# Patient Record
Sex: Female | Born: 1940 | Race: White | Hispanic: No | Smoking: Never smoker
Health system: Southern US, Community
[De-identification: ages and names within clinical notes are randomized; demographics above are authoritative.]

## PROBLEM LIST (undated history)

## (undated) DIAGNOSIS — M1990 Unspecified osteoarthritis, unspecified site: Secondary | ICD-10-CM

## (undated) DIAGNOSIS — C801 Malignant (primary) neoplasm, unspecified: Secondary | ICD-10-CM

## (undated) DIAGNOSIS — E119 Type 2 diabetes mellitus without complications: Secondary | ICD-10-CM

## (undated) DIAGNOSIS — K219 Gastro-esophageal reflux disease without esophagitis: Secondary | ICD-10-CM

## (undated) DIAGNOSIS — E538 Deficiency of other specified B group vitamins: Secondary | ICD-10-CM

## (undated) DIAGNOSIS — E785 Hyperlipidemia, unspecified: Secondary | ICD-10-CM

## (undated) DIAGNOSIS — I10 Essential (primary) hypertension: Secondary | ICD-10-CM

## (undated) DIAGNOSIS — R918 Other nonspecific abnormal finding of lung field: Secondary | ICD-10-CM

## (undated) DIAGNOSIS — Z853 Personal history of malignant neoplasm of breast: Secondary | ICD-10-CM

## (undated) DIAGNOSIS — D509 Iron deficiency anemia, unspecified: Secondary | ICD-10-CM

## (undated) HISTORY — PX: MASTECTOMY PARTIAL / LUMPECTOMY: SUR851

## (undated) HISTORY — DX: Other nonspecific abnormal finding of lung field: R91.8

## (undated) HISTORY — DX: Personal history of malignant neoplasm of breast: Z85.3

## (undated) HISTORY — PX: HIP SURGERY: SHX245

## (undated) HISTORY — DX: Deficiency of other specified B group vitamins: E53.8

## (undated) HISTORY — DX: Iron deficiency anemia, unspecified: D50.9

## (undated) HISTORY — DX: Type 2 diabetes mellitus without complications: E11.9

## (undated) HISTORY — DX: Essential (primary) hypertension: I10

## (undated) HISTORY — DX: Hyperlipidemia, unspecified: E78.5

---

## 2000-08-03 ENCOUNTER — Other Ambulatory Visit: Admission: RE | Admit: 2000-08-03 | Discharge: 2000-08-03 | Payer: Self-pay | Admitting: Family Medicine

## 2000-11-05 ENCOUNTER — Encounter (INDEPENDENT_AMBULATORY_CARE_PROVIDER_SITE_OTHER): Payer: Self-pay | Admitting: *Deleted

## 2000-11-05 ENCOUNTER — Ambulatory Visit (HOSPITAL_COMMUNITY): Admission: RE | Admit: 2000-11-05 | Discharge: 2000-11-05 | Payer: Self-pay | Admitting: Gastroenterology

## 2002-07-11 ENCOUNTER — Encounter: Admission: RE | Admit: 2002-07-11 | Discharge: 2002-07-11 | Payer: Self-pay | Admitting: Family Medicine

## 2002-07-11 ENCOUNTER — Encounter: Payer: Self-pay | Admitting: Family Medicine

## 2003-02-16 ENCOUNTER — Inpatient Hospital Stay (HOSPITAL_COMMUNITY): Admission: EM | Admit: 2003-02-16 | Discharge: 2003-02-19 | Payer: Self-pay | Admitting: Emergency Medicine

## 2003-02-17 ENCOUNTER — Encounter (INDEPENDENT_AMBULATORY_CARE_PROVIDER_SITE_OTHER): Payer: Self-pay | Admitting: Specialist

## 2003-02-26 ENCOUNTER — Encounter: Admission: RE | Admit: 2003-02-26 | Discharge: 2003-02-26 | Payer: Self-pay | Admitting: Sports Medicine

## 2003-09-18 ENCOUNTER — Encounter: Admission: RE | Admit: 2003-09-18 | Discharge: 2003-09-18 | Payer: Self-pay | Admitting: Family Medicine

## 2003-09-24 ENCOUNTER — Encounter: Admission: RE | Admit: 2003-09-24 | Discharge: 2003-09-24 | Payer: Self-pay | Admitting: Family Medicine

## 2004-11-04 ENCOUNTER — Other Ambulatory Visit: Admission: RE | Admit: 2004-11-04 | Discharge: 2004-11-04 | Payer: Self-pay | Admitting: Family Medicine

## 2005-01-23 ENCOUNTER — Encounter: Admission: RE | Admit: 2005-01-23 | Discharge: 2005-01-23 | Payer: Self-pay | Admitting: Family Medicine

## 2005-01-23 ENCOUNTER — Encounter (INDEPENDENT_AMBULATORY_CARE_PROVIDER_SITE_OTHER): Payer: Self-pay | Admitting: Diagnostic Radiology

## 2005-01-23 ENCOUNTER — Encounter (INDEPENDENT_AMBULATORY_CARE_PROVIDER_SITE_OTHER): Payer: Self-pay | Admitting: *Deleted

## 2005-02-08 ENCOUNTER — Encounter: Admission: RE | Admit: 2005-02-08 | Discharge: 2005-02-08 | Payer: Self-pay | Admitting: Surgery

## 2005-02-10 ENCOUNTER — Ambulatory Visit (HOSPITAL_COMMUNITY): Admission: RE | Admit: 2005-02-10 | Discharge: 2005-02-10 | Payer: Self-pay | Admitting: Surgery

## 2005-02-10 ENCOUNTER — Encounter (INDEPENDENT_AMBULATORY_CARE_PROVIDER_SITE_OTHER): Payer: Self-pay | Admitting: *Deleted

## 2005-02-26 ENCOUNTER — Encounter (INDEPENDENT_AMBULATORY_CARE_PROVIDER_SITE_OTHER): Payer: Self-pay | Admitting: Specialist

## 2005-02-26 ENCOUNTER — Ambulatory Visit (HOSPITAL_COMMUNITY): Admission: RE | Admit: 2005-02-26 | Discharge: 2005-02-26 | Payer: Self-pay | Admitting: Surgery

## 2005-03-03 ENCOUNTER — Ambulatory Visit: Payer: Self-pay | Admitting: Oncology

## 2005-04-06 ENCOUNTER — Ambulatory Visit: Admission: RE | Admit: 2005-04-06 | Discharge: 2005-06-05 | Payer: Self-pay | Admitting: Radiation Oncology

## 2005-05-04 ENCOUNTER — Encounter: Admission: RE | Admit: 2005-05-04 | Discharge: 2005-05-04 | Payer: Self-pay | Admitting: Oncology

## 2005-05-08 ENCOUNTER — Ambulatory Visit: Payer: Self-pay | Admitting: Oncology

## 2005-05-18 ENCOUNTER — Ambulatory Visit: Payer: Self-pay | Admitting: Oncology

## 2005-06-12 ENCOUNTER — Ambulatory Visit: Payer: Self-pay | Admitting: Internal Medicine

## 2005-06-23 ENCOUNTER — Ambulatory Visit: Payer: Self-pay | Admitting: Internal Medicine

## 2005-08-07 ENCOUNTER — Ambulatory Visit: Payer: Self-pay | Admitting: Oncology

## 2005-08-11 LAB — CBC & DIFF AND RETIC
BASO%: 0.7 % (ref 0.0–2.0)
LYMPH%: 32.3 % (ref 14.0–48.0)
MCHC: 33.9 g/dL (ref 32.0–36.0)
MCV: 89.7 fL (ref 81.0–101.0)
MONO%: 8.7 % (ref 0.0–13.0)
Platelets: 274 10*3/uL (ref 145–400)
RBC: 3.99 10*6/uL (ref 3.70–5.32)
RDW: 14.9 % — ABNORMAL HIGH (ref 11.3–14.5)
Retic %: 3.1 % — ABNORMAL HIGH (ref 0.4–2.3)
WBC: 3.9 10*3/uL (ref 3.9–10.0)

## 2006-01-25 ENCOUNTER — Encounter: Admission: RE | Admit: 2006-01-25 | Discharge: 2006-01-25 | Payer: Self-pay | Admitting: Oncology

## 2006-01-29 ENCOUNTER — Ambulatory Visit: Payer: Self-pay | Admitting: Oncology

## 2006-02-02 LAB — COMPREHENSIVE METABOLIC PANEL
ALT: 18 U/L (ref 0–35)
Albumin: 4.3 g/dL (ref 3.5–5.2)
Alkaline Phosphatase: 51 U/L (ref 39–117)
CO2: 26 mEq/L (ref 19–32)
Potassium: 4.6 mEq/L (ref 3.5–5.3)
Sodium: 138 mEq/L (ref 135–145)
Total Bilirubin: 0.6 mg/dL (ref 0.3–1.2)
Total Protein: 7.2 g/dL (ref 6.0–8.3)

## 2006-02-02 LAB — CBC WITH DIFFERENTIAL/PLATELET
BASO%: 0.4 % (ref 0.0–2.0)
Basophils Absolute: 0 10*3/uL (ref 0.0–0.1)
EOS%: 3 % (ref 0.0–7.0)
MCH: 30.5 pg (ref 26.0–34.0)
MCHC: 33.9 g/dL (ref 32.0–36.0)
MCV: 90 fL (ref 81.0–101.0)
MONO%: 7.7 % (ref 0.0–13.0)
RBC: 4 10*6/uL (ref 3.70–5.32)
RDW: 15.2 % — ABNORMAL HIGH (ref 11.3–14.5)

## 2006-02-02 LAB — CANCER ANTIGEN 27.29: CA 27.29: 13 U/mL (ref 0–39)

## 2006-08-02 ENCOUNTER — Ambulatory Visit: Payer: Self-pay | Admitting: Oncology

## 2006-08-04 LAB — CBC WITH DIFFERENTIAL/PLATELET
BASO%: 0.5 % (ref 0.0–2.0)
EOS%: 2.8 % (ref 0.0–7.0)
LYMPH%: 40 % (ref 14.0–48.0)
MCH: 26.9 pg (ref 26.0–34.0)
MCHC: 34 g/dL (ref 32.0–36.0)
MCV: 79 fL — ABNORMAL LOW (ref 81.0–101.0)
MONO%: 9.3 % (ref 0.0–13.0)
Platelets: 271 10*3/uL (ref 145–400)
RBC: 3.97 10*6/uL (ref 3.70–5.32)

## 2006-08-04 LAB — COMPREHENSIVE METABOLIC PANEL
AST: 14 U/L (ref 0–37)
Albumin: 4.1 g/dL (ref 3.5–5.2)
BUN: 10 mg/dL (ref 6–23)
CO2: 24 mEq/L (ref 19–32)
Calcium: 8.9 mg/dL (ref 8.4–10.5)
Chloride: 104 mEq/L (ref 96–112)
Creatinine, Ser: 0.76 mg/dL (ref 0.40–1.20)
Potassium: 4.1 mEq/L (ref 3.5–5.3)
Sodium: 140 mEq/L (ref 135–145)

## 2006-08-04 LAB — CANCER ANTIGEN 27.29: CA 27.29: 16 U/mL (ref 0–39)

## 2007-02-02 ENCOUNTER — Encounter: Admission: RE | Admit: 2007-02-02 | Discharge: 2007-02-02 | Payer: Self-pay | Admitting: Oncology

## 2007-02-07 ENCOUNTER — Ambulatory Visit: Payer: Self-pay | Admitting: Oncology

## 2007-02-09 LAB — CBC & DIFF AND RETIC
BASO%: 0.3 % (ref 0.0–2.0)
Basophils Absolute: 0 10e3/uL (ref 0.0–0.1)
EOS%: 2.4 % (ref 0.0–7.0)
Eosinophils Absolute: 0.1 10e3/uL (ref 0.0–0.5)
HCT: 36.7 % (ref 34.8–46.6)
HGB: 12.6 g/dL (ref 11.6–15.9)
IRF: 0.41 — ABNORMAL HIGH (ref 0.130–0.330)
LYMPH%: 33.2 % (ref 14.0–48.0)
MCH: 29.5 pg (ref 26.0–34.0)
MCHC: 34.3 g/dL (ref 32.0–36.0)
MCV: 86 fL (ref 81.0–101.0)
MONO#: 0.4 10e3/uL (ref 0.1–0.9)
MONO%: 8.1 % (ref 0.0–13.0)
NEUT#: 3.1 10e3/uL (ref 1.5–6.5)
NEUT%: 56 % (ref 39.6–76.8)
Platelets: 259 10e3/uL (ref 145–400)
RBC: 4.27 10e6/uL (ref 3.70–5.32)
RDW: 16.3 % — ABNORMAL HIGH (ref 11.3–14.5)
RETIC #: 103.3 10e3/uL (ref 19.7–115.1)
Retic %: 2.4 % — ABNORMAL HIGH (ref 0.4–2.3)
WBC: 5.5 10e3/uL (ref 3.9–10.0)
lymph#: 1.8 10e3/uL (ref 0.9–3.3)

## 2007-02-09 LAB — COMPREHENSIVE METABOLIC PANEL WITH GFR
ALT: 20 U/L (ref 0–35)
AST: 23 U/L (ref 0–37)
Albumin: 3.6 g/dL (ref 3.5–5.2)
Alkaline Phosphatase: 57 U/L (ref 39–117)
BUN: 7 mg/dL (ref 6–23)
CO2: 29 meq/L (ref 19–32)
Calcium: 9 mg/dL (ref 8.4–10.5)
Chloride: 105 meq/L (ref 96–112)
Creatinine, Ser: 0.72 mg/dL (ref 0.40–1.20)
Glucose, Bld: 114 mg/dL — ABNORMAL HIGH (ref 70–99)
Potassium: 3.9 meq/L (ref 3.5–5.3)
Sodium: 137 meq/L (ref 135–145)
Total Bilirubin: 0.9 mg/dL (ref 0.3–1.2)
Total Protein: 7.8 g/dL (ref 6.0–8.3)

## 2007-02-09 LAB — FERRITIN: Ferritin: 10 ng/mL (ref 10–291)

## 2007-05-06 ENCOUNTER — Encounter: Admission: RE | Admit: 2007-05-06 | Discharge: 2007-05-06 | Payer: Self-pay | Admitting: Oncology

## 2007-08-08 ENCOUNTER — Ambulatory Visit: Payer: Self-pay | Admitting: Oncology

## 2008-02-17 ENCOUNTER — Encounter: Admission: RE | Admit: 2008-02-17 | Discharge: 2008-02-17 | Payer: Self-pay | Admitting: Oncology

## 2008-08-07 ENCOUNTER — Ambulatory Visit: Payer: Self-pay | Admitting: Oncology

## 2008-08-09 LAB — COMPREHENSIVE METABOLIC PANEL
ALT: 14 U/L (ref 0–35)
AST: 16 U/L (ref 0–37)
Albumin: 4.2 g/dL (ref 3.5–5.2)
CO2: 26 mEq/L (ref 19–32)
Calcium: 10.3 mg/dL (ref 8.4–10.5)
Chloride: 102 mEq/L (ref 96–112)
Potassium: 3.9 mEq/L (ref 3.5–5.3)

## 2008-08-09 LAB — CBC WITH DIFFERENTIAL/PLATELET
BASO%: 0.5 % (ref 0.0–2.0)
Basophils Absolute: 0 10*3/uL (ref 0.0–0.1)
EOS%: 2.2 % (ref 0.0–7.0)
HCT: 35.4 % (ref 34.8–46.6)
HGB: 11.8 g/dL (ref 11.6–15.9)
MCH: 27.5 pg (ref 25.1–34.0)
MCHC: 33.5 g/dL (ref 31.5–36.0)
MONO#: 0.4 10*3/uL (ref 0.1–0.9)
RDW: 16.4 % — ABNORMAL HIGH (ref 11.2–14.5)
WBC: 4.1 10*3/uL (ref 3.9–10.3)
lymph#: 1.1 10*3/uL (ref 0.9–3.3)

## 2008-08-09 LAB — CANCER ANTIGEN 27.29: CA 27.29: 32 U/mL (ref 0–39)

## 2009-02-25 ENCOUNTER — Encounter: Admission: RE | Admit: 2009-02-25 | Discharge: 2009-02-25 | Payer: Self-pay | Admitting: Family Medicine

## 2009-06-19 ENCOUNTER — Encounter: Admission: RE | Admit: 2009-06-19 | Discharge: 2009-06-19 | Payer: Self-pay | Admitting: Oncology

## 2009-10-22 ENCOUNTER — Ambulatory Visit: Payer: Self-pay | Admitting: Oncology

## 2009-10-24 LAB — CBC WITH DIFFERENTIAL/PLATELET
BASO%: 0.5 % (ref 0.0–2.0)
Basophils Absolute: 0 10*3/uL (ref 0.0–0.1)
EOS%: 2.6 % (ref 0.0–7.0)
Eosinophils Absolute: 0.1 10*3/uL (ref 0.0–0.5)
HCT: 39.2 % (ref 34.8–46.6)
HGB: 13.4 g/dL (ref 11.6–15.9)
LYMPH%: 30.7 % (ref 14.0–49.7)
MCH: 31.7 pg (ref 25.1–34.0)
MCHC: 34.2 g/dL (ref 31.5–36.0)
MCV: 92.8 fL (ref 79.5–101.0)
MONO#: 0.4 10*3/uL (ref 0.1–0.9)
MONO%: 9.3 % (ref 0.0–14.0)
NEUT#: 2.7 10*3/uL (ref 1.5–6.5)
NEUT%: 56.9 % (ref 38.4–76.8)
Platelets: 274 10*3/uL (ref 145–400)
RBC: 4.23 10*6/uL (ref 3.70–5.45)
RDW: 14.8 % — ABNORMAL HIGH (ref 11.2–14.5)
WBC: 4.8 10*3/uL (ref 3.9–10.3)
lymph#: 1.5 10*3/uL (ref 0.9–3.3)

## 2009-10-24 LAB — COMPREHENSIVE METABOLIC PANEL
ALT: 17 U/L (ref 0–35)
AST: 19 U/L (ref 0–37)
Albumin: 4.6 g/dL (ref 3.5–5.2)
Alkaline Phosphatase: 72 U/L (ref 39–117)
BUN: 10 mg/dL (ref 6–23)
CO2: 25 mEq/L (ref 19–32)
Calcium: 10.2 mg/dL (ref 8.4–10.5)
Chloride: 102 mEq/L (ref 96–112)
Creatinine, Ser: 0.77 mg/dL (ref 0.40–1.20)
Glucose, Bld: 99 mg/dL (ref 70–99)
Potassium: 4.2 mEq/L (ref 3.5–5.3)
Sodium: 141 mEq/L (ref 135–145)
Total Bilirubin: 0.5 mg/dL (ref 0.3–1.2)
Total Protein: 7.8 g/dL (ref 6.0–8.3)

## 2009-10-24 LAB — VITAMIN D 25 HYDROXY (VIT D DEFICIENCY, FRACTURES): Vit D, 25-Hydroxy: 27 ng/mL — ABNORMAL LOW (ref 30–89)

## 2009-10-24 LAB — CANCER ANTIGEN 27.29: CA 27.29: 22 U/mL (ref 0–39)

## 2010-04-20 ENCOUNTER — Encounter: Payer: Self-pay | Admitting: Oncology

## 2010-07-01 ENCOUNTER — Other Ambulatory Visit: Payer: Self-pay | Admitting: Oncology

## 2010-07-01 ENCOUNTER — Encounter (HOSPITAL_BASED_OUTPATIENT_CLINIC_OR_DEPARTMENT_OTHER): Payer: Medicare Other | Admitting: Oncology

## 2010-07-01 DIAGNOSIS — C50919 Malignant neoplasm of unspecified site of unspecified female breast: Secondary | ICD-10-CM

## 2010-07-01 DIAGNOSIS — Z170 Estrogen receptor positive status [ER+]: Secondary | ICD-10-CM

## 2010-07-01 DIAGNOSIS — Z9889 Other specified postprocedural states: Secondary | ICD-10-CM

## 2010-07-01 LAB — CBC WITH DIFFERENTIAL/PLATELET
BASO%: 0.6 % (ref 0.0–2.0)
Basophils Absolute: 0 10*3/uL (ref 0.0–0.1)
EOS%: 2.4 % (ref 0.0–7.0)
Eosinophils Absolute: 0.1 10*3/uL (ref 0.0–0.5)
HCT: 41.7 % (ref 34.8–46.6)
HGB: 14.1 g/dL (ref 11.6–15.9)
LYMPH%: 27.8 % (ref 14.0–49.7)
MCH: 30.2 pg (ref 25.1–34.0)
MCHC: 33.8 g/dL (ref 31.5–36.0)
MCV: 89.4 fL (ref 79.5–101.0)
MONO#: 0.4 10*3/uL (ref 0.1–0.9)
MONO%: 7.8 % (ref 0.0–14.0)
NEUT#: 3.6 10*3/uL (ref 1.5–6.5)
NEUT%: 61.4 % (ref 38.4–76.8)
Platelets: 311 10*3/uL (ref 145–400)
RBC: 4.66 10*6/uL (ref 3.70–5.45)
RDW: 13.7 % (ref 11.2–14.5)
WBC: 5.8 10*3/uL (ref 3.9–10.3)
lymph#: 1.6 10*3/uL (ref 0.9–3.3)

## 2010-07-11 ENCOUNTER — Ambulatory Visit
Admission: RE | Admit: 2010-07-11 | Discharge: 2010-07-11 | Disposition: A | Discharge status: Home / Self Care | Payer: Medicare Other | Source: Ambulatory Visit | Attending: Oncology | Admitting: Oncology

## 2010-07-11 DIAGNOSIS — Z9889 Other specified postprocedural states: Secondary | ICD-10-CM

## 2010-08-15 NOTE — Discharge Summary (Signed)
NAME:  Amber Walls, Amber Walls                         ACCOUNT NO.:  [id number]   MEDICAL RECORD NO.:  [id number]                   PATIENT TYPE:  INP   LOCATION:  4709                                 FACILITY:  [facility]   PHYSICIAN:  Rebecca L. Jolinda Croak, M.D.          DATE OF BIRTH:  10-27-1940   DATE OF ADMISSION:  02/16/2003  DATE OF DISCHARGE:  02/19/2003                                 DISCHARGE SUMMARY   ATTENDING:  Lurena Joiner L. Jolinda Croak, M.D.   INTERN:  Ursula Beath, M.D.   DISCHARGE DIAGNOSES:  1. Microcytic anemia.  2. Barrett's esophagitis.  3. Diverticulosis.  4. Diabetes mellitus type 2.  5. Gastroesophageal reflux disease.  6. Hyperlipidemia.   DISCHARGE MEDICATIONS:  1. Iron sulfate 325 mg one p.o. t.i.d.  2. Vitamin B complex one p.o. daily.  3. Metformin 500 mg one p.o. b.i.d.  4. Aspirin 325 mg one p.o. daily.  5. Asacol 1000 mg two tablets p.o. q.h.s.   DISPOSITION:  Discharged to home.   FOLLOWUP:  Followup will be with Dr. Purnell Shoemaker February 28, 2003 at 2:45 p.m. and  she is to call GI for a follow up appointment.   PROCEDURES PERFORMED:  1. Colonoscopy which showed internal external hemorrhoids, left-sided     diverticula, no blood noted, and otherwise within normal limits to the     terminal ileum.  2. EGD.  Diagnosis includes small hiatal hernia, obvious Barrett's     esophagitis and a biopsy was collected - the pathology was pending at     time of this dictation, and an otherwise normal EGD.  3. CT of the abdomen and pelvis was performed which were both negative.   CONSULTS:  GI - Dr. Vida Rigger.   Amber Walls is a 70 year old female that was admitted from Dr. Harlene Salts clinic  on November 19 for a very low hematocrit.  Upon arrival to the ED her CBC  showed a white count of 6.0, H&H of 7.8 and 24.9, and platelets of 271,000.  Amber Walls had been feeling much more tired than normal and finally felt so  bad that she was not able to go to work.  She then went  to the physician.  When her CBC was checked she was noted to have very low hemoglobin so she  was referred to the family practice teaching service for admission.  She  received 4 units of packed red blood cells and at the time of discharge her  H&H had improved to 11.2 and 34.5.  Because of her history of Barrett's  esophagitis there was some concern that she was having a GI bleed, but as  noted previously both the EGD and colonoscopy were negative for acute  bleeding.  A PT and INR were 14.4 and 1.2.  Absolute retic count was 104.4.  Her liver enzymes and function tests were within normal limits with the  exception of albumin which was minimally decreased  at 3.4.  A Chem-7 was  also within normal limits.  Cardiac enzymes x1 were negative.  Her iron was  13, total iron binding capacity 458, percent saturation was 3%, vitamin B12  was 167, folate was greater than 20.  It was noted that this was possibly a  mixed picture of microcytic and B12 deficiency anemia so she left the  hospital on the aforementioned iron supplementation as well as B vitamin  supplementation.  During her hospitalization she developed some bradycardic  episodes as she was on a telemetry bed.  She was asymptomatic during all  these episodes and this should be followed up as an outpatient by her  primary medical doctor.  As noted, her cardiac enzymes were within normal  limits.  Her diabetes was controlled on metformin and her CBGs remained  under good control.  Her GERD and hyperlipidemia were treated with her home  medications.  She was discharged on November 22 in improved condition.  She  should be followed carefully for her Barrett's esophagitis and history of  diverticulosis although these do not appear to be the cause of her current  anemia.      Ursula Beath, MD                     Randon Goldsmith. Jolinda Croak, M.D.    JT/MEDQ  D:  02/20/2003  T:  02/20/2003  Job:  [id number]   cc:   [facility]   Petra Kuba, M.D.  [address], [address]  [place]  [place] [address]  Fax: [phone]   Bernette Redbird, M.D.  [address]  [place], [place] [address]  Fax: [phone]

## 2010-08-15 NOTE — Procedures (Signed)
[place]. [facility]  Patient:    Amber Walls, Amber Walls                      MRN: [id number] Proc. Date: 11/05/00 Adm. Date:  [id number] Attending:  Rich Brave CC:         Dario Guardian, M.D.   Procedure Report  PROCEDURE:  Flexible sigmoidoscopy.  INDICATION:  A 70 year old for colon cancer screening.  She has not previously had sigmoidoscopy.  FINDINGS:  Bradycardia developed, uncomplicated.  Scattered diverticulosis to 60 cm.  DESCRIPTION OF PROCEDURE:  The patient provided written consent for the procedure.  Fleets enemas were administered as a prep.  The Olympus endoscope which had been used for her upper endoscopy was also used for this procedure and was advanced to about 60 cm, after which pull-back was performed.  At about the level of maximal insertion, the patient became bradycardic but was asymptomatic.  There was no evident drop in her oxygen saturation.  She was not diaphoretic and was readily arousable and voiced no complaints.  Her heart rate, however, got down to the low 30s.  At this time, she had passage of gas both from the mouth and the rectum, and pull-back was performed.  The quality of the prep was mediocre toward the top of the exam but distally it was very good, and it is felt that, for the distal 40 cm or so, everything was well-seen.  There were some scattered diverticula but no polyps, cancer, colitis, or vascular malformations, and retroflexion in the rectum was normal.  By this time, the heart rate was coming up.  The scope was removed from the patient. She passed more gas, and her heart rate was up in the 70s.  The estimated time that the heart rate was below 50 was probably about one to two minutes, again without any evidence of clinical instability.  IMPRESSION: 1. Bradycardia, presumably due to vagal stimulation from the exam,    spontaneously resolved. 2. Diverticular disease, otherwise normal screening  flexible sigmoidoscopy.  PLAN:  Repeat flexible sigmoidoscopy or colonoscopy in five years. DD:  11/05/00 TD:  11/06/00 Job: [id number]

## 2010-08-15 NOTE — Op Note (Signed)
NAME:  Amber Walls, Amber Walls                         ACCOUNT NO.:  [id number]   MEDICAL RECORD NO.:  [id number]                   PATIENT TYPE:  INP   LOCATION:  4709                                 FACILITY:  [facility]   PHYSICIAN:  Petra Kuba, M.D.                 DATE OF BIRTH:  [birth year]   DATE OF PROCEDURE:  02/17/2003  DATE OF DISCHARGE:                                 OPERATIVE REPORT   PROCEDURE:  Esophagogastroduodenoscopy with biopsy.   INDICATIONS FOR PROCEDURE:  A patient with iron deficiency, history of  Barrett's, due for repeat screening.  Consent was signed  after the risks and benefits, methods and options were  thoroughly discussed last night with the patient.   MEDICATIONS:  Demerol 40, Versed 4.   DESCRIPTION OF PROCEDURE:  The video endoscope was inserted by direct  vision. The proximal  and midesophagus was normal.  Beginning at about 30  cm, there was about a 5-cm area of obvious Barrett's.  No mass lesion,  ulceration or erythema was seen. This area was extensively biopsied at the  end of the procedure. There was a tiny to small hiatal hernia.   The scope was passed into the stomach and advanced through a normal antrum  and normal pylorus into a normal duodenal bulb and around the __________ to  a normal 2nd portion of the duodenum. A few duodenal biopsies were obtained  to rule out malabsorption but it looked  normal. The scope was slowly  withdrawn back to the bulb. A good look there ruled out abnormalities in  that location. The stomach  was evaluated on retroflexion and straight  visualization with a good look at the cardia, fundus, angularis, lesser and  greater curve and no additional findings were seen. The biopsies of  Barrett's were obtained at this time.   Air was suctioned and the scope was slowly withdrawn. Again the proximal  and mid esophagus was normal.   The scope was removed. The patient tolerated the procedure well. There were  no obvious  immediate complications.   ENDOSCOPIC DIAGNOSES:  1. Small hiatal hernia.  2. Obvious Barrett's, approximately 5 cm, status post  biopsy.  3. Otherwise normal esophagogastroduodenoscopy, status post duodenal biopsy     to rule out malabsorption.   PLAN:  Await pathology but continue workup with the colonoscopy tomorrow.  Hold iron until after the colonoscopy since that will interfere with the  prep.                                               Petra Kuba, M.D.    MEM/MEDQ  D:  02/17/2003  T:  02/18/2003  Job:  [id number]   cc:   Bernette Redbird, M.D.  [address] [place]  [place], [place] [address]  Fax: [phone]   Leighton Roach McDiarmid, M.D.  [address] [place]  [place] [address]  Fax: [phone]   Lianne Bushy, M.D.  [address]  [place]  [place] [address]  Fax: [phone]

## 2010-08-15 NOTE — Op Note (Signed)
NAME:  Amber Walls, Amber Walls                         ACCOUNT NO.:  [id number]   MEDICAL RECORD NO.:  [id number]                   PATIENT TYPE:  INP   LOCATION:  4709                                 FACILITY:  [facility]   PHYSICIAN:  Petra Kuba, M.D.                 DATE OF BIRTH:  [birth year]   DATE OF PROCEDURE:  02/18/2003  DATE OF DISCHARGE:                                 OPERATIVE REPORT   PROCEDURE:  Colonoscopy.   INDICATION:  Iron deficiency.  Nondiagnostic endoscopy.  Consent was signed  after risks, benefits, methods, options thoroughly discussed in the office.   MEDICINES USED:  1. Demerol 40.  2. Versed 4.   DESCRIPTION OF PROCEDURE:  Rectal inspection was pertinent for external  hemorrhoids, small.  Digital exam was negative.  Video pediatric adjustable  colonoscope was inserted and with some difficulty due to a looping, long,  tortuous colon was able to be advanced to the cecum.  This did require  rolling her on her back, various abdominal pressures.  Other than the left-  sided diverticula, no abnormalities were seen.  The cecum was identified by  the appendiceal orifice and the ileocecal valve.  In fact, the scope was  inserted a short ways in the terminal ileum which was normal.  No blood was  seen coming from above.  The scope was slowly withdrawn.  The prep was  adequate.  There was some liquid stool that required suctioning only.  On  slow withdrawal through the colon, other than the left-sided diverticula  mentioned above, no abnormalities were seen.  And we fell back around a  loop, we did try to readvance around the curves to decrease chances of  missing things.  Once back in the rectum, anorectal pull-through and  retroflexion confirmed some small hemorrhoids.  The scope was reinserted a  short ways up the left side of the colon; air was suctioned, the scope  removed.  The patient tolerated the procedure well.  There was no obvious  immediate complication.   ENDOSCOPIC DIAGNOSES:  1. Internal-external hemorrhoids.  2. Left-sided diverticula.  3. No blood seen.  4. Otherwise, within normal limits to the terminal ileum.   PLAN:  1. Okay to begin iron, advance diet at home soon.  2. Would follow up per Bernette Redbird, M.D. but probably recheck guaiacs and     CBCs in one month to determine if a small-bowel follow-through, capsule     endoscopy, or any other work-up plans are needed.  3. Still await on the biopsies for the Barrett's.                                               Petra Kuba, M.D.    MEM/MEDQ  D:  02/18/2003  T:  02/18/2003  Job:  [id number]   cc:   Lianne Bushy, M.D.  [address]  [place]  [place] [address]  Fax: [phone]   Leighton Roach McDiarmid, M.D.  [address] [place]  [place] [address]  Fax: [phone]   Bernette Redbird, M.D.  [address]  [place], [place] [address]  Fax: [phone]

## 2010-08-15 NOTE — Consult Note (Signed)
NAME:  Amber Walls, Amber Walls                         ACCOUNT NO.:  [id number]   MEDICAL RECORD NO.:  [id number]                   PATIENT TYPE:  INP   LOCATION:  4709                                 FACILITY:  [facility]   PHYSICIAN:  Petra Kuba, M.D.                 DATE OF BIRTH:  1941/01/23   DATE OF CONSULTATION:  02/16/2003  DATE OF DISCHARGE:                                   CONSULTATION   REPORT TITLE:  GASTROENTEROLOGY CONSULTATION   CONSULTING PHYSICIAN:  Petra Kuba, M.D.   REFERRING PHYSICIAN:  Leighton Roach McDiarmid, M.D.   HISTORY OF PRESENT ILLNESS:  The patient was seen at the request of the  [facility] Service for symptomatic microcytic anemia.  Today  she is actually guaiac negative.  She does have a history of Barrett's and  GERD, and is on an aspirin a day, but has minimal upper tract symptoms.  She  has lost weight, and says she has been watching her diet due to her  diabetes.  She has not seen any blood from her bowels, vagina, or her urine.  There is no change in bowel habits, no abdominal symptoms.  Has not been on  any extra aspirin or nonsteroidals, except for her one a day.   PAST MEDICAL HISTORY:  Pertinent for diabetes, increased limits, Barrett's  esophagus, diverticulosis, hemorrhoids, increased cholesterol, hypertension.  She also had a left hip surgery.   ALLERGIES:  None.   CURRENT MEDICATIONS:  1. Azmacort.  2. Aspirin.  3. Metformin.  4. Fish oil.   SOCIAL HISTORY:  Denies smoking or drinking, and minimizes over-the-counter  medicine use.   REVIEW OF SYSTEMS:  Pertinent for a flexible sigmoidoscopy in 2002 with some  diverticula, only went 60 cm.  No other workup.  Has had two endoscopies in  the past.   PHYSICAL EXAMINATION:  GENERAL:  In no acute distress.  VITAL SIGNS:  See chart.  RECTAL:  Examination pertinent for being guaiac negative today.   LABORATORY DATA:  Pertinent for microcytic anemia with a hemoglobin of 6.4.  Normal chemistries and coagulations.   ASSESSMENT:  Iron-deficiency anemia.  My guess would be related to her  hiatal hernia, Barrett's, and being on an aspirin a day.  May even have an  ulcer.   PLAN:  Risks, benefits, and methods of endoscopy were discussed.  Will  proceed tomorrow, but probably needs a colonoscopy just to make sure there  are no right colon lesions that have not been looked at.  Will probably do  that on Sunday.  In the meantime, agree with clear liquids, pump inhibitors,  holding aspirin.   Will follow with you.  Thank you very much.  Petra Kuba, M.D.    MEM/MEDQ  D:  02/16/2003  T:  02/18/2003  Job:  [id number]   cc:   Bernette Redbird, M.D.  [address] [place]  [place], [place] [address]  Fax: [phone]   Lianne Bushy, M.D.  [address]  [place]  [place] [address]  Fax: [phone]   Leighton Roach McDiarmid, M.D.  [address] [place]  [place] [address]  Fax: [phone]

## 2010-08-15 NOTE — Op Note (Signed)
[place]. [facility]  Patient:    Amber Walls, Amber Walls                      MRN: [id number] Proc. Date: 11/05/00 Adm. Date:  [id number] Attending:  Rich Brave CC:         Dario Guardian, M.D.   Operative Report  PROCEDURE:  Upper endoscopy with biopsies.  INDICATION:  Follow-up of apparent Barretts esophagus on endoscopies seven years ago.  The patient is free at this time of reflux symptoms, using Prevacid every several days, and is free of dysphagia symptoms, status post an esophageal dilatation back in 1995.  FINDINGS:  Possible short-segment Barretts esophagus.  Biopsies obtained. Small hiatal hernia with esophageal ring.  DESCRIPTION OF PROCEDURE:  The patient provided written consent for the procedure and received sedation totalling fentanyl 50 mcg and Versed 5 mg IV, without arrhythmias or significant desaturation.  The Olympus XQ-140 video endoscope was passed under direct vision.  The vocal cords looked normal.  The esophagus was readily entered.  There was a well-defined esophageal ring at the squamocolumnar junction that was fairly widely patent.  Depending on the insufflation status of the esophagus, there appeared to be a short tongue of Barretts mucosa, fairly broad, extending above the ring, so biopsies were obtained of that at the end of the procedure.  I was not able to define the lower esophageal sphincter endoscopically, and it was not clear whether or not the ring was at the level of the lower esophageal sphincter, so I went ahead and, in a separate jar, obtained biopsies of the area below the ring down to the hiatal hernia pouch for analysis as well.  There was a 2-3 cm hiatal hernia.  The stomach contained no significant residual and had normal mucosa without evidence of gastritis, erosions, ulcers, polyps, or masses.  Retroflex view was normal except for a slightly patulous diaphragmatic hiatus.  The pylorus,  duodenal bulb, and proximal second duodenum were briefly seen and appeared normal.  After obtaining the above-mentioned biopsies, the scope was removed from the patient, who tolerated the procedure well and without apparent complication.  IMPRESSION:  Possible short-segment Barretts esophagus, but the presence or absence of it was somewhat uncertain due to inadequate definition of anatomic landmarks.  PLAN:  Await pathology.  If intestinal metaplasia is present, the patient should probably have periodic endoscopic surveillance; otherwise, it would not likely be needed.  Would continue low-dose Prevacid indefinitely. DD:  11/05/00 TD:  11/06/00 Job: [id number]

## 2010-08-15 NOTE — Op Note (Signed)
NAME:  Amber Walls, Amber Walls               ACCOUNT NO.:  [id number]   MEDICAL RECORD NO.:  [id number]          PATIENT TYPE:  AMB   LOCATION:  SDS                          FACILITY:  [facility]   PHYSICIAN:  Wilmon Arms. Corliss Skains, M.D. DATE OF BIRTH:  01/10/1941   DATE OF PROCEDURE:  02/26/2005  DATE OF DISCHARGE:  02/26/2005                                 OPERATIVE REPORT   PREOPERATIVE DIAGNOSIS:  Left breast cancer.   POSTOPERATIVE DIAGNOSIS:  Left breast cancer.   PROCEDURE PERFORMED:  1.  Left breast re-excision.  2.  Left axillary sentinel lymph node biopsy.   SURGEON:  Wilmon Arms. Tsuei, M.D.   ANESTHESIA:  General via LMA.   SPECIMENS:  1.  Left axillary sentinel lymph node.  2.  Lateral margin of left breast.   INDICATIONS FOR PROCEDURE:  The patient is a 70 year old female who  developed a palpable lump near her left nipple.  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  She underwent a lumpectomy and in addition to DCIS, several tiny foci of  invasive cancer were found; the largest measured 1.3 mm; this was within 1  mm of the lateral margin.  We recommended re-excision of the lateral margin  as well as an axillary sentinel lymph node biopsy.   DESCRIPTION OF PROCEDURE:  The patient was brought to the operating room  after being injected in Nuclear Medicine.  She was placed in supine position  on the operating room table.  After an adequate level of general anesthesia  was obtained, 1.5 mL of methylene blue dye were injected around her previous  incision above the nipple.  This area was massaged for 5 minutes.  Her left  chest and axilla were prepped with Betadine and draped in a sterile fashion.  The Neoprobe was used to determine an area of activity in her axilla.  A 3-  cm incision was made over this site.  Dissection was carried down to  subcutaneous tissues using Bovie cautery.  Deep in the axilla, an area of  activity  was noted within the Neoprobe.  A small lump was palpated.  Unfortunately, we were not able to see any blue dye in this area.  This  palpable area was dissected free from the surrounding tissues.  Several  lymphatics were ligated with clips.  However, once this mass was removed, it  was found to be only a small lipoma.  Once again we interrogated the area  with the Neoprobe.  There was a small lymph node directly posterior to the  lipoma.  This was dissected free and the lymphatics were ligated with clips.  The node was removed and was noted to have ex vivo activity of 80.  Background activity was 4.  Activity at the primary injection site was over  1000.  The wound was then thoroughly irrigated.  Hemostasis was obtained  with Bovie cautery.  The wound was closed with a 3-0 Vicryl and 4-0 Monocryl  while the node was sent for touch prep.  Telephoned report from the  pathologist confirmed that there was no evidence of tumor cells on the touch  prep.  We then opened her old incision above her left nipple and extended  this slightly laterally.  The seroma cavity was entered.  Approximately a  1.5-cm lateral margin was taken with cautery; this extended fairly close to  her axillary dissection site.  Hemostasis was obtained with cautery.  The  wound was then thoroughly irrigated.  The wound was closed with 3-0 Vicryl  and 4-0 Monocryl in a subcuticular  fashion.  Steri-Strips were placed after infiltrating both surgical sites  with 0.25% Marcaine.  Clean dressings were applied.  The patient was  extubated and brought to the recovery room in stable condition.  All sponge,  needle and instrument counts were correct.      Wilmon Arms. Tsuei, M.D.  Electronically Signed     MKT/MEDQ  D:  02/26/2005  T:  02/27/2005  Job:  [id number]

## 2010-08-15 NOTE — Op Note (Signed)
NAME:  Amber Walls, Amber Walls               ACCOUNT NO.:  [id number]   MEDICAL RECORD NO.:  [id number]          PATIENT TYPE:  AMB   LOCATION:  SDS                          FACILITY:  [facility]   PHYSICIAN:  Wilmon Arms. Corliss Skains, M.D. DATE OF BIRTH:  [birth year]   DATE OF PROCEDURE:  02/10/2005  DATE OF DISCHARGE:                                 OPERATIVE REPORT   PREOPERATIVE DIAGNOSIS:  Left breast ductal carcinoma in situ.   POSTOPERATIVE DIAGNOSIS:  Left breast ductal carcinoma in situ.   PROCEDURE PERFORMED:  Left breast lumpectomy.   SURGEON:  Wilmon Arms. Tsuei, M.D.   ANESTHESIA:  Local MAC.   INDICATIONS:  The patient is a 70 year old female who developed a palpable  mass.  Mammogram and ultrasound confirmed this finding.  She underwent a  core biopsy, which showed DCIS.  MRI showed that the disease was localized  to this area.  The patient presents for lumpectomy.   DESCRIPTION OF PROCEDURE:  The patient was brought to the operating room and  placed in the supine position on the operating room table.  After an  adequate level of intravenous sedation was given, her left breast was  prepped with Betadine and draped in a sterile fashion.  A time-out was taken  to confirm the proper placement and proper procedure.  A curvilinear  incision was made at the upper edge of the areola after infiltrating with  0.25% Marcaine.  Dissection was carried down into the subcutaneous tissues.  The mass was grasped with an Allis clamp and a circumferential dissection  was taken with Bovie cautery.  This was taken down for approximately 4 cm.  The specimen was amputated with the cautery and was oriented with a long  suture lateral and a short suture superior.  This was sent for pathologic  examination.  The wound was irrigated.  Hemostasis was obtained with Bovie  cautery.  The wound was closed with 3-0 Vicryl and 4-0 Monocryl.  Steri-  Strips and clean dressing were applied.  The patient was awakened and  brought to the recovery room in stable condition.  All sponge, instrument  and needle counts were correct.      Wilmon Arms. Tsuei, M.D.  Electronically Signed     MKT/MEDQ  D:  02/10/2005  T:  02/11/2005  Job:  [id number]

## 2011-05-19 ENCOUNTER — Other Ambulatory Visit: Payer: Self-pay | Admitting: Geriatric Medicine

## 2011-05-19 DIAGNOSIS — Z853 Personal history of malignant neoplasm of breast: Secondary | ICD-10-CM

## 2011-07-15 ENCOUNTER — Ambulatory Visit
Admission: RE | Admit: 2011-07-15 | Discharge: 2011-07-15 | Disposition: A | Discharge status: Home / Self Care | Payer: Medicare Other | Source: Ambulatory Visit | Attending: Geriatric Medicine | Admitting: Geriatric Medicine

## 2011-07-15 DIAGNOSIS — Z853 Personal history of malignant neoplasm of breast: Secondary | ICD-10-CM

## 2012-06-08 ENCOUNTER — Other Ambulatory Visit: Payer: Self-pay

## 2012-06-08 DIAGNOSIS — Z1231 Encounter for screening mammogram for malignant neoplasm of breast: Secondary | ICD-10-CM

## 2012-06-08 DIAGNOSIS — Z853 Personal history of malignant neoplasm of breast: Secondary | ICD-10-CM

## 2012-07-15 ENCOUNTER — Ambulatory Visit
Admission: RE | Admit: 2012-07-15 | Discharge: 2012-07-15 | Disposition: A | Discharge status: Home / Self Care | Payer: Medicare Other | Source: Ambulatory Visit

## 2012-07-15 DIAGNOSIS — Z1231 Encounter for screening mammogram for malignant neoplasm of breast: Secondary | ICD-10-CM

## 2012-07-15 DIAGNOSIS — Z853 Personal history of malignant neoplasm of breast: Secondary | ICD-10-CM

## 2012-11-02 ENCOUNTER — Ambulatory Visit: Payer: Self-pay | Admitting: Ophthalmology

## 2013-01-25 ENCOUNTER — Ambulatory Visit: Payer: Self-pay | Admitting: Ophthalmology

## 2013-05-01 DIAGNOSIS — Z853 Personal history of malignant neoplasm of breast: Secondary | ICD-10-CM | POA: Insufficient documentation

## 2013-05-01 DIAGNOSIS — E785 Hyperlipidemia, unspecified: Secondary | ICD-10-CM | POA: Insufficient documentation

## 2013-05-01 DIAGNOSIS — E119 Type 2 diabetes mellitus without complications: Secondary | ICD-10-CM | POA: Insufficient documentation

## 2013-07-05 ENCOUNTER — Other Ambulatory Visit: Payer: Self-pay

## 2013-07-05 DIAGNOSIS — Z9889 Other specified postprocedural states: Secondary | ICD-10-CM

## 2013-07-05 DIAGNOSIS — Z1231 Encounter for screening mammogram for malignant neoplasm of breast: Secondary | ICD-10-CM

## 2013-07-05 DIAGNOSIS — Z853 Personal history of malignant neoplasm of breast: Secondary | ICD-10-CM

## 2013-07-21 ENCOUNTER — Ambulatory Visit: Payer: Medicare Other

## 2013-08-11 ENCOUNTER — Encounter (INDEPENDENT_AMBULATORY_CARE_PROVIDER_SITE_OTHER): Payer: Self-pay

## 2013-08-11 ENCOUNTER — Ambulatory Visit
Admission: RE | Admit: 2013-08-11 | Discharge: 2013-08-11 | Disposition: A | Discharge status: Home / Self Care | Payer: Medicare HMO | Source: Ambulatory Visit

## 2013-08-11 DIAGNOSIS — Z853 Personal history of malignant neoplasm of breast: Secondary | ICD-10-CM

## 2013-08-11 DIAGNOSIS — Z9889 Other specified postprocedural states: Secondary | ICD-10-CM

## 2013-08-11 DIAGNOSIS — Z1231 Encounter for screening mammogram for malignant neoplasm of breast: Secondary | ICD-10-CM

## 2013-09-12 DIAGNOSIS — I10 Essential (primary) hypertension: Secondary | ICD-10-CM | POA: Insufficient documentation

## 2013-09-19 ENCOUNTER — Encounter: Payer: Self-pay | Admitting: Internal Medicine

## 2014-07-30 ENCOUNTER — Other Ambulatory Visit: Payer: Self-pay

## 2014-07-30 DIAGNOSIS — Z1231 Encounter for screening mammogram for malignant neoplasm of breast: Secondary | ICD-10-CM

## 2014-08-16 ENCOUNTER — Ambulatory Visit: Payer: Medicare HMO

## 2014-08-23 ENCOUNTER — Ambulatory Visit
Admission: RE | Admit: 2014-08-23 | Discharge: 2014-08-23 | Disposition: A | Discharge status: Home / Self Care | Payer: Medicare HMO | Source: Ambulatory Visit

## 2014-08-23 DIAGNOSIS — Z1231 Encounter for screening mammogram for malignant neoplasm of breast: Secondary | ICD-10-CM

## 2015-05-03 ENCOUNTER — Emergency Department (HOSPITAL_COMMUNITY)
Admission: EM | Admit: 2015-05-03 | Discharge: 2015-05-03 | Disposition: A | Discharge status: Home / Self Care | Payer: Medicare HMO | Attending: Emergency Medicine | Admitting: Emergency Medicine

## 2015-05-03 ENCOUNTER — Emergency Department (HOSPITAL_COMMUNITY): Payer: Medicare HMO

## 2015-05-03 DIAGNOSIS — R0602 Shortness of breath: Secondary | ICD-10-CM | POA: Diagnosis present

## 2015-05-03 DIAGNOSIS — J40 Bronchitis, not specified as acute or chronic: Secondary | ICD-10-CM

## 2015-05-03 DIAGNOSIS — R911 Solitary pulmonary nodule: Secondary | ICD-10-CM | POA: Diagnosis not present

## 2015-05-03 DIAGNOSIS — J209 Acute bronchitis, unspecified: Secondary | ICD-10-CM | POA: Diagnosis not present

## 2015-05-03 DIAGNOSIS — R918 Other nonspecific abnormal finding of lung field: Secondary | ICD-10-CM

## 2015-05-03 LAB — CBC WITH DIFFERENTIAL/PLATELET
BASOS ABS: 0 10*3/uL (ref 0.0–0.1)
BASOS PCT: 0 %
EOS ABS: 0 10*3/uL (ref 0.0–0.7)
Eosinophils Relative: 0 %
HCT: 38.7 % (ref 36.0–46.0)
Hemoglobin: 12.5 g/dL (ref 12.0–15.0)
Lymphocytes Relative: 13 %
Lymphs Abs: 1.1 10*3/uL (ref 0.7–4.0)
MCH: 30.7 pg (ref 26.0–34.0)
MCHC: 32.3 g/dL (ref 30.0–36.0)
MCV: 95.1 fL (ref 78.0–100.0)
MONO ABS: 0.6 10*3/uL (ref 0.1–1.0)
MONOS PCT: 7 %
NEUTROS PCT: 80 %
Neutro Abs: 6.9 10*3/uL (ref 1.7–7.7)
Platelets: 203 10*3/uL (ref 150–400)
RBC: 4.07 MIL/uL (ref 3.87–5.11)
RDW: 15 % (ref 11.5–15.5)
WBC: 8.6 10*3/uL (ref 4.0–10.5)

## 2015-05-03 LAB — BASIC METABOLIC PANEL
Anion gap: 19 — ABNORMAL HIGH (ref 5–15)
BUN: <5 mg/dL — ABNORMAL LOW (ref 6–20)
CALCIUM: 10 mg/dL (ref 8.9–10.3)
CO2: 20 mmol/L — ABNORMAL LOW (ref 22–32)
CREATININE: 0.81 mg/dL (ref 0.44–1.00)
Chloride: 101 mmol/L (ref 101–111)
Glucose, Bld: 152 mg/dL — ABNORMAL HIGH (ref 65–99)
Potassium: 3.1 mmol/L — ABNORMAL LOW (ref 3.5–5.1)
SODIUM: 140 mmol/L (ref 135–145)

## 2015-05-03 LAB — BRAIN NATRIURETIC PEPTIDE: B NATRIURETIC PEPTIDE 5: 139.7 pg/mL — AB (ref 0.0–100.0)

## 2015-05-03 LAB — D-DIMER, QUANTITATIVE (NOT AT ARMC): D DIMER QUANT: 0.51 ug{FEU}/mL — AB (ref 0.00–0.50)

## 2015-05-03 MED ORDER — HYDROCODONE-HOMATROPINE 5-1.5 MG/5ML PO SYRP: 5.0000 mL | ORAL_SOLUTION | Freq: Four times a day (QID) | ORAL | Status: DC | PRN

## 2015-05-03 MED ORDER — AZITHROMYCIN 250 MG PO TABS: 250.0000 mg | ORAL_TABLET | Freq: Every day | ORAL | Status: DC

## 2015-05-03 MED ORDER — IPRATROPIUM BROMIDE 0.02 % IN SOLN
0.5000 mg | Freq: Once | RESPIRATORY_TRACT | Status: AC
  Administered 2015-05-03: 0.5 mg via RESPIRATORY_TRACT
  Filled 2015-05-03: qty 2.5

## 2015-05-03 MED ORDER — PREDNISONE 20 MG PO TABS
40.0000 mg | ORAL_TABLET | Freq: Once | ORAL | Status: AC
  Administered 2015-05-03: 40 mg via ORAL
  Filled 2015-05-03: qty 2

## 2015-05-03 MED ORDER — HYDROCODONE-HOMATROPINE 5-1.5 MG/5ML PO SYRP
5.0000 mL | ORAL_SOLUTION | Freq: Once | ORAL | Status: AC
  Administered 2015-05-03: 5 mL via ORAL
  Filled 2015-05-03: qty 5

## 2015-05-03 MED ORDER — PREDNISONE 20 MG PO TABS: ORAL_TABLET | ORAL | Status: DC

## 2015-05-03 MED ORDER — ALBUTEROL (5 MG/ML) CONTINUOUS INHALATION SOLN
10.0000 mg/h | INHALATION_SOLUTION | RESPIRATORY_TRACT | Status: DC
  Administered 2015-05-03: 10 mg/h via RESPIRATORY_TRACT
  Filled 2015-05-03: qty 20

## 2015-05-03 MED ORDER — ALBUTEROL SULFATE HFA 108 (90 BASE) MCG/ACT IN AERS
2.0000 | INHALATION_SPRAY | Freq: Once | RESPIRATORY_TRACT | Status: AC
  Administered 2015-05-03: 2 via RESPIRATORY_TRACT
  Filled 2015-05-03: qty 6.7

## 2015-05-03 MED ORDER — AZITHROMYCIN 250 MG PO TABS
500.0000 mg | ORAL_TABLET | Freq: Once | ORAL | Status: AC
  Administered 2015-05-03: 500 mg via ORAL
  Filled 2015-05-03: qty 2

## 2015-05-03 NOTE — ED Notes (Signed)
Pt arrives from home via [place]. Pt states she began coughing at 1000 this morning and had a sore throat. Pt received 2.5 albuterol PTA. EMS reports pt was having expiratory wheezing. Pt is a&ox4 and at 98% on RA.

## 2015-05-03 NOTE — Discharge Instructions (Signed)
Community-Acquired Pneumonia/Bronchitis Pneumonia and bronchitis are infections of the lungs. There are different types of pneumonia. One type can develop while a person is in a hospital. A different type, called community-acquired pneumonia, develops in people who are not, or have not recently been, in the hospital or other health care facility.  CAUSES Pneumonia may be caused by bacteria, viruses, or funguses. Community-acquired pneumonia is often caused by Streptococcus pneumonia bacteria. These bacteria are often passed from one person to another by breathing in droplets from the cough or sneeze of an infected person. RISK FACTORS The condition is more likely to develop in:  People who havechronic diseases, such as chronic obstructive pulmonary disease (COPD), asthma, congestive heart failure, cystic fibrosis, diabetes, or kidney disease.  People who haveearly-stage or late-stage HIV.  People who havesickle cell disease.  People who havehad their spleen removed (splenectomy).  People who havepoor Human resources officer.  People who havemedical conditions that increase the risk of breathing in (aspirating) secretions their own mouth and nose.   People who havea weakened immune system (immunocompromised).  People who smoke.  People whotravel to areas where pneumonia-causing germs commonly exist.  People whoare around animal habitats or animals that have pneumonia-causing germs, including birds, bats, rabbits, cats, and farm animals. SYMPTOMS Symptoms of this condition include:  Adry cough.  A wet (productive) cough.  Fever.  Sweating.  Chest pain, especially when breathing deeply or coughing.  Rapid breathing or difficulty breathing.  Shortness of breath.  Shaking chills.  Fatigue.  Muscle aches. DIAGNOSIS Your health care provider will take a medical history and perform a physical exam. You may also have other tests, including:  Imaging studies of your  chest, including X-rays.  Tests to check your blood oxygen level and other blood gases.  Other tests on blood, mucus (sputum), fluid around your lungs (pleural fluid), and urine. If your pneumonia is severe, other tests may be done to identify the specific cause of your illness. TREATMENT The type of treatment that you receive depends on many factors, such as the cause of your pneumonia, the medicines you take, and other medical conditions that you have. For most adults, treatment and recovery from pneumonia may occur at home. In some cases, treatment must happen in a hospital. Treatment may include:  Antibiotic medicines, if the pneumonia was caused by bacteria.  Antiviral medicines, if the pneumonia was caused by a virus.  Medicines that are given by mouth or through an IV tube.  Oxygen.  Respiratory therapy. Although rare, treating severe pneumonia may include:  Mechanical ventilation. This is done if you are not breathing well on your own and you cannot maintain a safe blood oxygen level.  Thoracentesis. This procedureremoves fluid around one lung or both lungs to help you breathe better. HOME CARE INSTRUCTIONS  Take over-the-counter and prescription medicines only as told by your health care provider.  Only takecough medicine if you are losing sleep. Understand that cough medicine can prevent your body's natural ability to remove mucus from your lungs.  If you were prescribed an antibiotic medicine, take it as told by your health care provider. Do not stop taking the antibiotic even if you start to feel better.  Sleep in a semi-upright position at night. Try sleeping in a reclining chair, or place a few pillows under your head.  Do not use tobacco products, including cigarettes, chewing tobacco, and e-cigarettes. If you need help quitting, ask your health care provider.  Drink enough water to keep your urine  clear or pale yellow. This will help to thin out mucus secretions  in your lungs. PREVENTION There are ways that you can decrease your risk of developing community-acquired pneumonia. Consider getting a pneumococcal vaccine if:  You are older than 74 years of age.  You are older than 74 years of age and are undergoing cancer treatment, have chronic lung disease, or have other medical conditions that affect your immune system. Ask your health care provider if this applies to you. There are different types and schedules of pneumococcal vaccines. Ask your health care provider which vaccination option is best for you. You may also prevent community-acquired pneumonia if you take these actions:  Get an influenza vaccine every year. Ask your health care provider which type of influenza vaccine is best for you.  Go to the dentist on a regular basis.  Wash your hands often. Use hand sanitizer if soap and water are not available. SEEK MEDICAL CARE IF:  You have a fever.  You are losing sleep because you cannot control your cough with cough medicine. SEEK IMMEDIATE MEDICAL CARE IF:  You have worsening shortness of breath.  You have increased chest pain.  Your sickness becomes worse, especially if you are an older adult or have a weakened immune system.  You cough up blood.   This information is not intended to replace advice given to you by your health care provider. Make sure you discuss any questions you have with your health care provider.   Document Released: 03/16/2005 Document Revised: 12/05/2014 Document Reviewed: 07/11/2014 Elsevier Interactive Patient Education 2016 [place] need to have a recheck with your doctor on Monday or Tuesday. You have findings on your CT scan that will require further testing and monitoring. These include nodules that may be related to infection but also need to be further evaluated to rule out cancer. There also is a small amount of fluid identified by your heart, you may also need an echocardiogram. Return to  the emergency department if your symptoms are not improving with treatment or other concerns arise.

## 2015-05-03 NOTE — ED Provider Notes (Signed)
Patient is accepted signout from Dr. Dolly Rias. She has had cough and wheezing with URI like symptoms. She has been treated with hour-long DuoNeb, Zithromax and prednisone. Labs are pending for follow-up and reassessment the patient.  I have interviewed the patient and she describes cough for several days with symptoms initiating nasal congestion and sore throat. Symptoms have been worsening with increasing shortness of breath. On examination patient does have extensive wheezing in bilateral lower lobes with rhonchi that clear with cough. Mental status is clear and no significant respiratory distress at rest. Patient does not have calf tenderness.  D-dimer returned elevated. CT chest was obtained. Findings on chest or for nodules that are opined to be infectious versus inflammatory. She also has ground glass appearance at the bases of both lungs. Also identified is small pericardial effusion.  Based on patient's history of URI symptomology and cough with wheezing that improved significantly with DuoNeb and Hycodan, I feel symptoms are more likely to be bronchitic or atypical pneumonia in etiology. She is well in appearance at time of discharge. I have counseled the patient and the family on her management plan. She is to follow-up on Monday or Tuesday with her primary provider. She is advised that if symptoms are not improving or she expresses any worsening she is to return immediately to the emergency department.  Charlesetta Shanks, MD 05/03/15 2221

## 2015-05-03 NOTE — ED Provider Notes (Signed)
CSN: [id number]     Arrival date & time 05/03/15  1216 History   First MD Initiated Contact with Patient 05/03/15 1221     Chief Complaint  Patient presents with  . Cough  . Sore Throat     (Consider location/radiation/quality/duration/timing/severity/associated sxs/prior Treatment) Patient is a 75 y.o. female presenting with cough.  Cough Cough characteristics:  Non-productive Onset quality:  Sudden Duration:  3 hours Timing:  Constant Chronicity:  New Smoker: no   Relieved by:  None tried Ineffective treatments:  None tried Associated symptoms: shortness of breath   Associated symptoms: no chest pain, no chills, no fever, no rash, no sinus congestion and no sore throat     No past medical history on file. No past surgical history on file. No family history on file. Social History  Substance Use Topics  . Smoking status: Not on file  . Smokeless tobacco: Not on file  . Alcohol Use: Not on file   OB History    No data available     Review of Systems  Constitutional: Positive for activity change. Negative for fever and chills.  HENT: Negative for sore throat.   Eyes: Negative for pain.  Respiratory: Positive for cough and shortness of breath.   Cardiovascular: Negative for chest pain.  Skin: Negative for rash.  All other systems reviewed and are negative.     Allergies  Review of patient's allergies indicates not on file.  Home Medications   Prior to Admission medications   Not on File   BP 157/67 mmHg  Pulse 96  Temp(Src) 97.6 F (36.4 C) (Oral)  Resp 15  SpO2 100% Physical Exam  Constitutional: She is oriented to person, place, and time. She appears well-developed and well-nourished.  HENT:  Head: Normocephalic and atraumatic.  Eyes: Pupils are equal, round, and reactive to light.  Neck: Normal range of motion.  Cardiovascular: Normal rate and regular rhythm.   Pulmonary/Chest: Effort normal. No stridor. No respiratory distress. She has wheezes.   Abdominal: Soft. She exhibits no distension.  Neurological: She is alert and oriented to person, place, and time. No cranial nerve deficit.  Skin: Skin is warm and dry. No rash noted. No erythema.  Nursing note and vitals reviewed.   ED Course  Procedures (including critical care time) Labs Review Labs Reviewed - No data to display  Imaging Review No results found. I have personally reviewed and evaluated these images and lab results as part of my medical decision-making.   EKG Interpretation   Date/Time:  Friday May 03 2015 16:45:17 EST Ventricular Rate:  117 PR Interval:  182 QRS Duration: 92 QT Interval:  335 QTC Calculation: 467 R Axis:   -81 Text Interpretation:  Sinus or ectopic atrial tachycardia Left anterior  fascicular block Consider right ventricular hypertrophy Abnormal T,  consider ischemia, lateral leads ED PHYSICIAN INTERPRETATION AVAILABLE IN  [facility] Confirmed by TEST, Record ([id number]) on 05/04/2015 8:49:20 AM      MDM   Final diagnoses:  None   75 yo F here with cough and sob that started acutely this morning unlike anything she's had before. Initially examined and seemed c/w bronchitis/infectious etiology, so started on duo nebs and steroids. During ED stay patient stated she had chest pain, with age, dyspnea and other symptoms, d dimer, troponin, basic labs added. Dyspnea/cough symptoms improved but did not resolve. Care transferred to oncoming physician with plan to await labs, reassess and disposition appropriately.       Corene Cornea  Amber Maes, MD 05/04/15 1056

## 2015-05-03 NOTE — ED Notes (Signed)
Pt off unit for CT angio

## 2015-05-04 ENCOUNTER — Encounter (HOSPITAL_COMMUNITY): Payer: Self-pay | Admitting: Emergency Medicine

## 2015-05-07 MED ORDER — IOHEXOL 350 MG/ML SOLN
100.0000 mL | Freq: Once | INTRAVENOUS | Status: AC | PRN
  Administered 2015-05-03: 100 mL via INTRAVENOUS

## 2015-06-10 ENCOUNTER — Encounter: Payer: Self-pay | Admitting: Pulmonary Disease

## 2015-06-11 ENCOUNTER — Encounter: Payer: Self-pay | Admitting: Pulmonary Disease

## 2015-06-11 ENCOUNTER — Ambulatory Visit (INDEPENDENT_AMBULATORY_CARE_PROVIDER_SITE_OTHER): Payer: Medicare HMO | Admitting: Pulmonary Disease

## 2015-06-11 DIAGNOSIS — R918 Other nonspecific abnormal finding of lung field: Secondary | ICD-10-CM | POA: Diagnosis not present

## 2015-06-11 NOTE — Patient Instructions (Signed)
We will schedule you for follow-up CT of the chest in May 2017 with contrast for better look at the lymph node and follow-up of the lung nodules.  Follow up in clinic after the CT scan to review results and further workup if necessary.

## 2015-06-11 NOTE — Progress Notes (Signed)
Subjective:    Patient ID: Amber Walls, female    DOB: [birth year], 75 y.o.   MRN: [id number]  HPI Consult for evaluation of abnormal CT scan.  Amber Walls is a 75 year old with past medical history of breast cancer. She was sent to the emergency room in February 2017 for acute dyspnea, wheezing, respiratory tract infection. She had a CTA done which did not show any pulmonary embolus but showed some subcentimeter pulmonary nodules and an enlarged prevascular lymph node. She was treated with nebulizer, Zithromax, prednisone taper with improvement in symptoms. She is being sent here by her primary care for follow-up of the CT scan. Her EKG at that time was suggestive of pericarditis. She had a follow-up echocardiogram that showed a small pericardial effusion. She has follow-up with cardiology for further evaluation.  She was diagnosed with breast cancer in 2008. This was treated with surgery and XRT. There is no evidence of recurrence of lung cancer.   DATA: CT chest 05/03/15 1.3 cm prevascular mass, possible lymph node. Right middle lobe pulmonary nodules, 9 mm. No PE.  Echocardiogram 2/78/17 Mild LVH, normal LV systolic function,m EF 09%. Grade 2 diastolic dysfunction, degenerative mitral valve disease, dilated left atrium. RVH, normal RV systolic function, PASP cannot be accurately estimated. Small pericardial effusion.  Social history: She is a never smoker, no alcohol, drug use.  Family history: Lung cancer-father, brother. Breast cancer-sister Heart disease, diabetes-mother.  Patient Active Problem List   Diagnosis Date Noted  . BP (high blood pressure) 09/12/2013  . HLD (hyperlipidemia) 05/01/2013  . H/O malignant neoplasm of breast 05/01/2013  . Type 2 diabetes mellitus ([place]) 05/01/2013    Current outpatient prescriptions:  .  aspirin EC 81 MG tablet, Take 81 mg by mouth daily., Disp: , Rfl:  .  calcium-vitamin D (OSCAL WITH D) 500-200 MG-UNIT tablet, Take 1 tablet by  mouth daily with breakfast., Disp: , Rfl:  .  ferrous sulfate 325 (65 FE) MG tablet, Take 325 mg by mouth daily with breakfast., Disp: , Rfl:  .  HYDROcodone-homatropine (HYCODAN) 5-1.5 MG/5ML syrup, Take 5 mLs by mouth every 6 (six) hours as needed for cough., Disp: 120 mL, Rfl: 0 .  lisinopril (PRINIVIL,ZESTRIL) 20 MG tablet, Take 20 mg by mouth daily., Disp: , Rfl:  .  Multiple Vitamins-Minerals (VISION FORMULA PO), Take by mouth., Disp: , Rfl:  .  omega-3 acid ethyl esters (LOVAZA) 1 g capsule, Take 1 g by mouth 2 (two) times daily., Disp: , Rfl:  .  omeprazole (PRILOSEC) 40 MG capsule, Take 40 mg by mouth daily., Disp: , Rfl:  .  pioglitazone-metformin (ACTOPLUS MET) 15-850 MG tablet, Take 1 tablet by mouth 2 (two) times daily with a meal., Disp: , Rfl:  .  simvastatin (ZOCOR) 80 MG tablet, Take 80 mg by mouth at bedtime., Disp: , Rfl:  .  sitaGLIPtin (JANUVIA) 100 MG tablet, Take 100 mg by mouth daily., Disp: , Rfl:    Review of Systems  Constitutional: Negative.  Negative for fever and unexpected weight change.  HENT: Negative for congestion, dental problem, ear pain, nosebleeds, postnasal drip, rhinorrhea, sinus pressure, sneezing, sore throat and trouble swallowing.   Eyes: Negative.  Negative for redness and itching.  Respiratory: Positive for cough, chest tightness, shortness of breath and wheezing.   Cardiovascular: Positive for leg swelling. Negative for palpitations.  Gastrointestinal: Positive for vomiting. Negative for nausea.  Endocrine: Negative.   Genitourinary: Negative.  Negative for dysuria.  Musculoskeletal: Negative.  Negative for  joint swelling.  Skin: Negative.  Negative for rash.  Allergic/Immunologic: Negative.   Neurological: Positive for headaches.  Hematological: Bruises/bleeds easily.  Psychiatric/Behavioral: Negative.  Negative for dysphoric mood. The patient is not nervous/anxious.       Objective:   Physical Exam Blood pressure 132/58, pulse 79,  height 5' 3" (1.6 m), weight 171 lb 12.8 oz (77.928 kg), SpO2 96 %. Gen: No apparent distress Neuro: No gross focal deficits. Neck: No JVD, lymphadenopathy, thyromegaly. RS: Clear, No wheeze or crackles CVS: S1-S2 heard, no murmurs rubs gallops. Abdomen: Soft, positive bowel sounds. Extremities: No edema.    Assessment & Plan:  Prevascular mass, sub cm lung nodules H/O breast cancer.  The prevascular mass is likely lymph node. This may be inflammation, reactive in nature as she had bronchitis, pneumonia at that time, which is treated with antibiotics. Malignancy has to be ruled out given her history of breast cancer. I will repeat a CT scan in 3 months time. If her biopsy is needed then she will probably have to get a CT-guided biopsy as at I dont think this is accessible by EBUS.   Plan: - CT with contrast for follow up of lymph node and sub cm nodules.  Marshell Garfinkel MD [facility] Pager [phone] If no answer or after 3pm call: [phone] 06/11/2015, 4:46 PM

## 2015-06-11 NOTE — Progress Notes (Signed)
   Subjective:    Patient ID: Amber Walls, female    DOB: 02-25-1941, 75 y.o.   MRN: [id number]  HPI    Review of Systems     Objective:   Physical Exam        Assessment & Plan:

## 2015-06-19 ENCOUNTER — Encounter: Payer: Self-pay | Admitting: Pulmonary Disease

## 2015-07-02 DIAGNOSIS — I3139 Other pericardial effusion (noninflammatory): Secondary | ICD-10-CM | POA: Insufficient documentation

## 2015-07-25 ENCOUNTER — Other Ambulatory Visit (INDEPENDENT_AMBULATORY_CARE_PROVIDER_SITE_OTHER): Payer: Medicare HMO

## 2015-07-25 DIAGNOSIS — R918 Other nonspecific abnormal finding of lung field: Secondary | ICD-10-CM | POA: Diagnosis not present

## 2015-07-25 LAB — BASIC METABOLIC PANEL
BUN: 8 mg/dL (ref 6–23)
CHLORIDE: 101 meq/L (ref 96–112)
CO2: 29 meq/L (ref 19–32)
CREATININE: 0.69 mg/dL (ref 0.40–1.20)
Calcium: 10 mg/dL (ref 8.4–10.5)
GFR: 88.43 mL/min (ref >60.00)
Glucose, Bld: 130 mg/dL — ABNORMAL HIGH (ref 70–99)
POTASSIUM: 4 meq/L (ref 3.5–5.1)
Sodium: 139 mEq/L (ref 135–145)

## 2015-07-26 ENCOUNTER — Encounter: Payer: Self-pay | Admitting: Internal Medicine

## 2015-07-30 DIAGNOSIS — R0789 Other chest pain: Secondary | ICD-10-CM | POA: Insufficient documentation

## 2015-07-31 ENCOUNTER — Ambulatory Visit (INDEPENDENT_AMBULATORY_CARE_PROVIDER_SITE_OTHER): Payer: 59 | Admitting: Pulmonary Disease

## 2015-07-31 ENCOUNTER — Encounter: Payer: Self-pay | Admitting: Pulmonary Disease

## 2015-07-31 ENCOUNTER — Ambulatory Visit (INDEPENDENT_AMBULATORY_CARE_PROVIDER_SITE_OTHER)
Admission: RE | Admit: 2015-07-31 | Discharge: 2015-07-31 | Disposition: A | Discharge status: Home / Self Care | Payer: 59 | Source: Ambulatory Visit | Attending: Pulmonary Disease | Admitting: Pulmonary Disease

## 2015-07-31 VITALS — BP 112/64 | HR 76 | Ht 63.0 in | Wt 168.8 lb

## 2015-07-31 DIAGNOSIS — R918 Other nonspecific abnormal finding of lung field: Secondary | ICD-10-CM

## 2015-07-31 MED ORDER — IOPAMIDOL (ISOVUE-300) INJECTION 61%
80.0000 mL | Freq: Once | INTRAVENOUS | Status: AC | PRN
  Administered 2015-07-31: 80 mL via INTRAVENOUS

## 2015-07-31 NOTE — Patient Instructions (Signed)
We will follow up her CT findings with a repeat CT with contrast in 6 months. We'll schedule an appointment for you after this to discuss the results.

## 2015-07-31 NOTE — Progress Notes (Signed)
Subjective:    Patient ID: Amber Walls, female    DOB: 07-05-1940, 75 y.o.   MRN: [id number]  HPI Follow up for abnormal CT scan.  Amber Walls is a 75 year old with past medical history of breast cancer. She was sent to the emergency room in February 2017 for acute dyspnea, wheezing, respiratory tract infection. She had a CTA done which did not show any pulmonary embolus but showed some subcentimeter pulmonary nodules and an enlarged prevascular lymph node. She was treated with nebulizer, Zithromax, prednisone taper with improvement in symptoms. She is being sent here by her primary care for follow-up of the CT scan. Her EKG at that time was suggestive of pericarditis. She had a follow-up echocardiogram that showed a small pericardial effusion. She was seen by cardiology and the pericardial effusion was stable. A stress test did not show any ischemia  She was diagnosed with breast cancer in 2008. This was treated with surgery and XRT. There is no evidence of recurrence of lung cancer.   DATA: CT chest 05/03/15 1.3 cm prevascular mass, possible lymph node. Right middle lobe pulmonary nodules, 9 mm. No PE.  CT chest with contrast 07/31/15 Stable soft tissue nodule in the epicardial fat. Right middle lobe pulmonary nodules have resolved. Images reviewed.  Stress test  07/12/15 No significant ischemia  Echocardiogram 05/18/15 Mild LVH, normal LV systolic function,m EF 12%. Grade 2 diastolic dysfunction, degenerative mitral valve disease, dilated left atrium. RVH, normal RV systolic function, PASP cannot be accurately estimated. Small pericardial effusion.  Social history: She is a never smoker, no alcohol, drug use.  Family history: Lung cancer-father, brother. Breast cancer-sister Heart disease, diabetes-mother.  Patient Active Problem List   Diagnosis Date Noted  . BP (high blood pressure) 09/12/2013  . HLD (hyperlipidemia) 05/01/2013  . H/O malignant neoplasm of breast  05/01/2013  . Type 2 diabetes mellitus ([place]) 05/01/2013    Current outpatient prescriptions:  .  aspirin EC 81 MG tablet, Take 81 mg by mouth daily., Disp: , Rfl:  .  calcium-vitamin D (OSCAL WITH D) 500-200 MG-UNIT tablet, Take 1 tablet by mouth daily with breakfast., Disp: , Rfl:  .  ferrous sulfate 325 (65 FE) MG tablet, Take 325 mg by mouth daily with breakfast., Disp: , Rfl:  .  lisinopril (PRINIVIL,ZESTRIL) 20 MG tablet, Take 20 mg by mouth daily., Disp: , Rfl:  .  Multiple Vitamins-Minerals (VISION FORMULA PO), Take by mouth., Disp: , Rfl:  .  omega-3 acid ethyl esters (LOVAZA) 1 g capsule, Take 1 g by mouth 2 (two) times daily., Disp: , Rfl:  .  omeprazole (PRILOSEC) 40 MG capsule, Take 40 mg by mouth daily., Disp: , Rfl:  .  pioglitazone-metformin (ACTOPLUS MET) 15-850 MG tablet, Take 1 tablet by mouth 2 (two) times daily with a meal., Disp: , Rfl:  .  simvastatin (ZOCOR) 80 MG tablet, Take 80 mg by mouth at bedtime., Disp: , Rfl:  .  sitaGLIPtin (JANUVIA) 100 MG tablet, Take 100 mg by mouth daily., Disp: , Rfl:  .  amLODipine (NORVASC) 5 MG tablet, Take 5 mg by mouth daily. Reported on 07/31/2015, Disp: , Rfl:    Review of Systems  Constitutional: Negative.  Negative for fever and unexpected weight change.  HENT: Negative for congestion, dental problem, ear pain, nosebleeds, postnasal drip, rhinorrhea, sinus pressure, sneezing, sore throat and trouble swallowing.   Eyes: Negative.  Negative for redness and itching.  Respiratory: Positive for cough, chest tightness, shortness of breath and  wheezing.   Cardiovascular: Positive for leg swelling. Negative for palpitations.  Gastrointestinal: Positive for vomiting. Negative for nausea.  Endocrine: Negative.   Genitourinary: Negative.  Negative for dysuria.  Musculoskeletal: Negative.  Negative for joint swelling.  Skin: Negative.  Negative for rash.  Allergic/Immunologic: Negative.   Neurological: Positive for headaches.    Hematological: Bruises/bleeds easily.  Psychiatric/Behavioral: Negative.  Negative for dysphoric mood. The patient is not nervous/anxious.       Objective:   Physical Exam Blood pressure 112/64, pulse 76, height 5' 3"  (1.6 m), weight 168 lb 12.8 oz (76.567 kg), SpO2 100 %. Gen: No apparent distress Neuro: No gross focal deficits. Neck: No JVD, lymphadenopathy, thyromegaly. RS: Clear, No wheeze or crackles CVS: S1-S2 heard, no murmurs rubs gallops. Abdomen: Soft, positive bowel sounds. Extremities: No edema.    Assessment & Plan:  Prevascular mass,  Sub cm lung nodules > resolved H/O breast cancer.  The prevascular mass is likely lymph node or small lymph node. It is stable on repeat CT. We will continue to follow with a CT in 6 months. Malignancy still need to keep in mind given her history of breast cancer. If her biopsy is needed then she will probably have to get a CT-guided biopsy.  The nodules have resolved and These may be inflammation, reactive in nature as she had bronchitis, pneumonia at that time, which was treated with antibiotics.    Plan: - CT with contrast for follow up of lymph node.   Marshell Garfinkel MD [facility] Pager [phone] If no answer or after 3pm call: [phone] 07/31/2015, 1:48 PM

## 2015-09-25 DIAGNOSIS — R918 Other nonspecific abnormal finding of lung field: Secondary | ICD-10-CM | POA: Insufficient documentation

## 2017-03-20 IMAGING — CT CT ANGIO CHEST
1 of 8 series · 17 of 36 positions shown · IV contrast (Iodine)
Comparison: Chest radiograph 05/03/2015

CLINICAL DATA: Patient with cough and wheezing. Elevated D-dimer.
Chest pain.

EXAM:
CT ANGIOGRAPHY CHEST WITH CONTRAST
TECHNIQUE: Multidetector CT imaging of the chest was performed using the
standard protocol during bolus administration of intravenous
contrast. Multiplanar CT image reconstructions and MIPs were
obtained to evaluate the vascular anatomy.
CONTRAST:  100 cc Omnipaque 350

[Series 407: thins pacs · axial · 0.70mm/px · z∈[+442,+677]mm · 17 of 265 slices shown]
[im 15/265  lung]
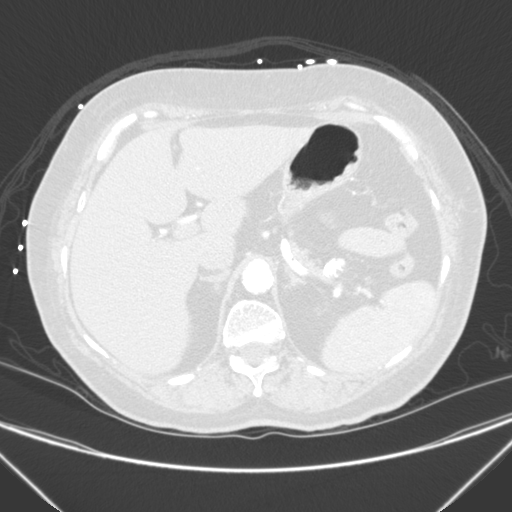
[im 30/265  mediastinal]
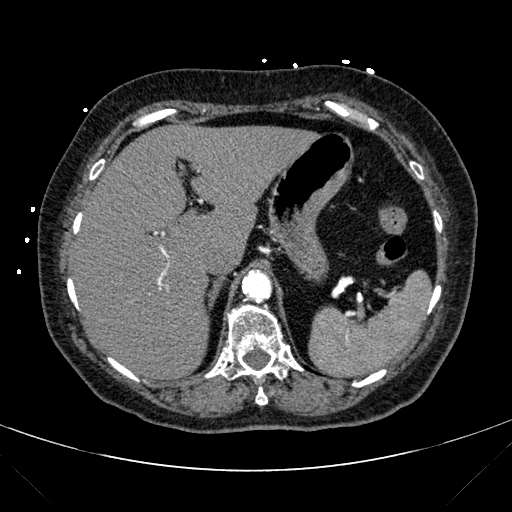
[im 45/265  lung]
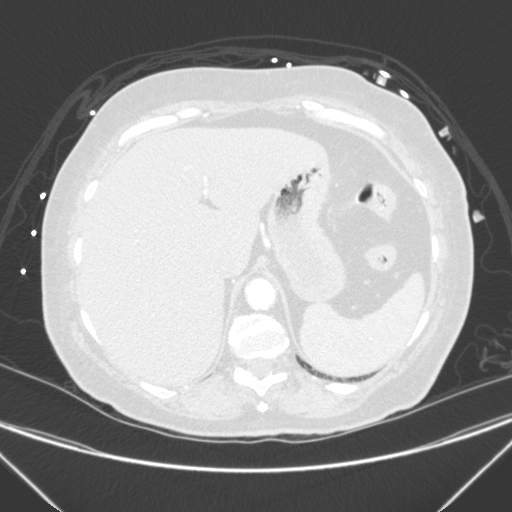
[im 59/265  mediastinal]
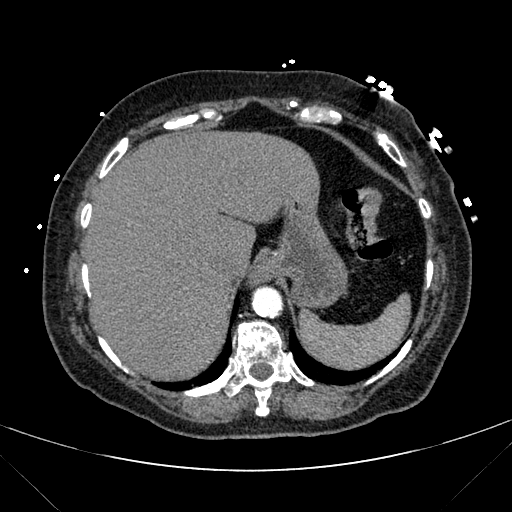
[im 74/265  lung]
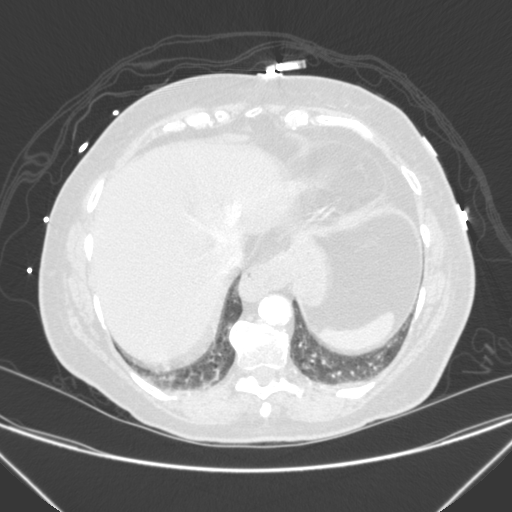
[im 89/265  mediastinal]
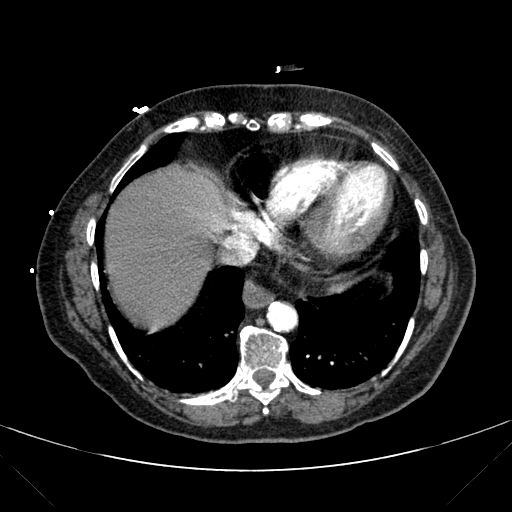
[im 103/265  lung]
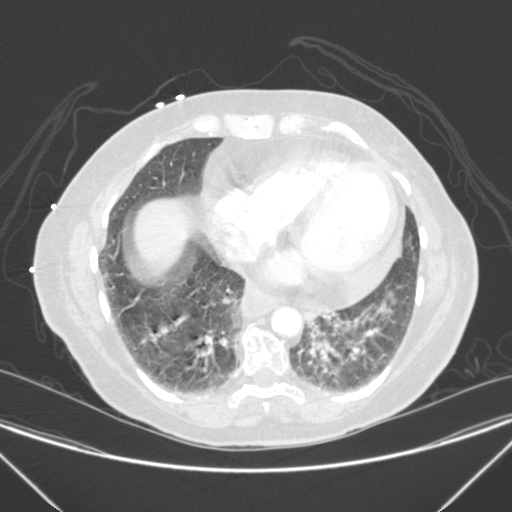
[im 118/265  mediastinal]
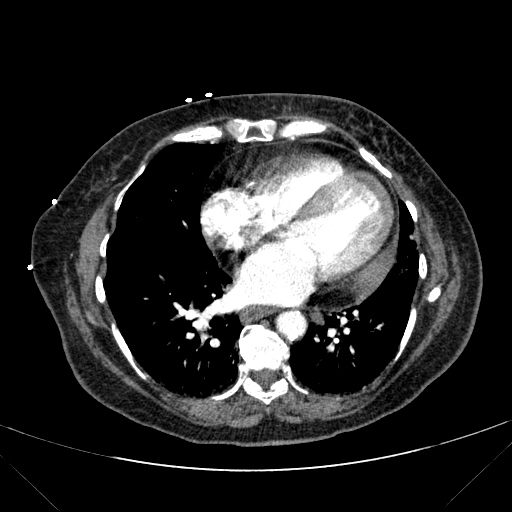
[im 133/265  lung]
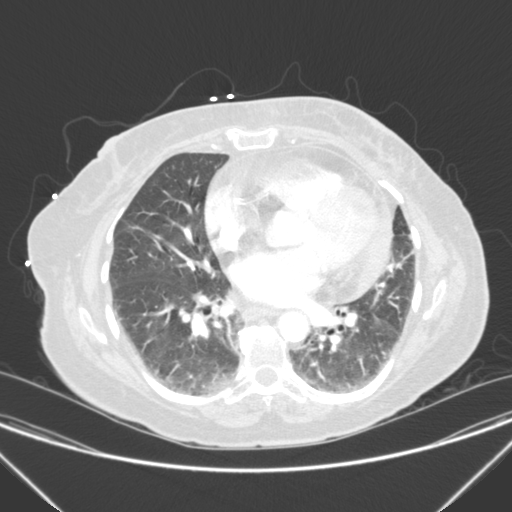
[im 147/265  mediastinal]
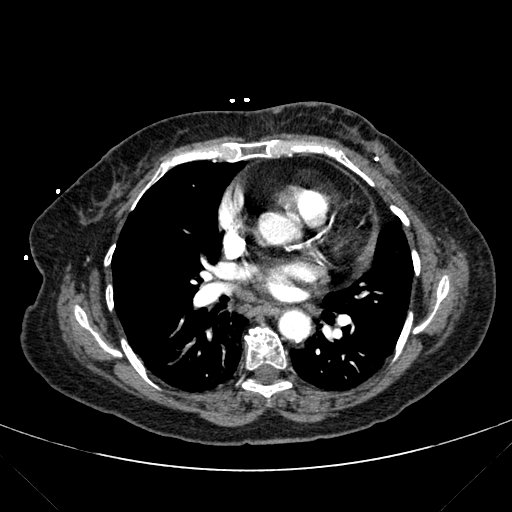
[im 162/265  lung]
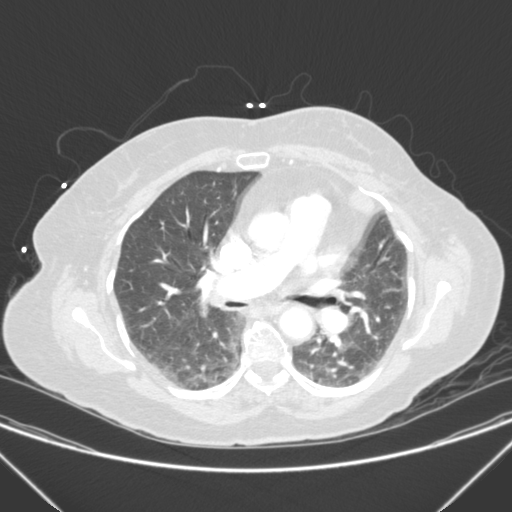
[im 177/265  mediastinal]
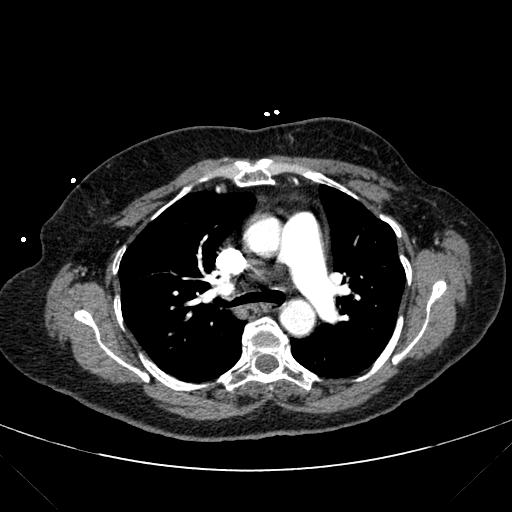
[im 191/265  lung]
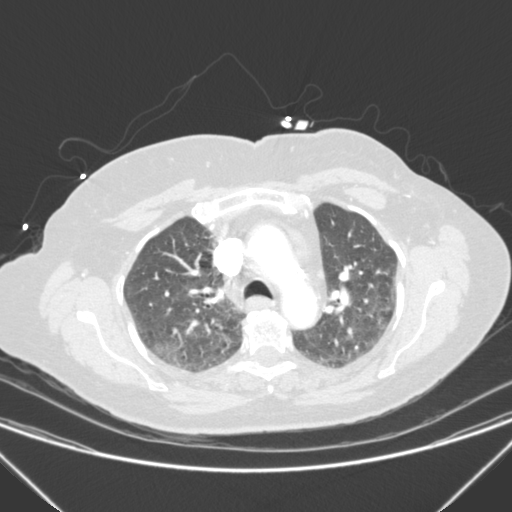
[im 206/265  mediastinal]
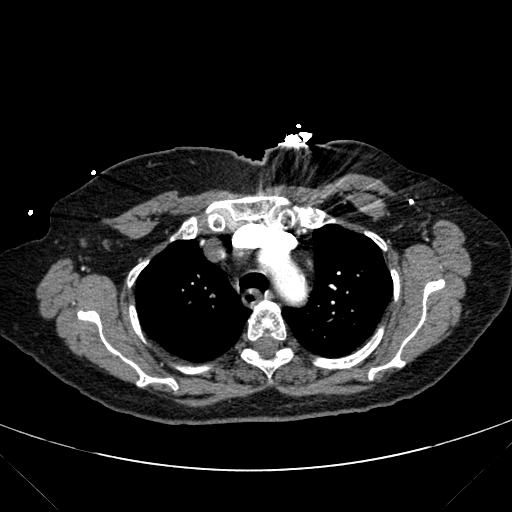
[im 221/265  lung]
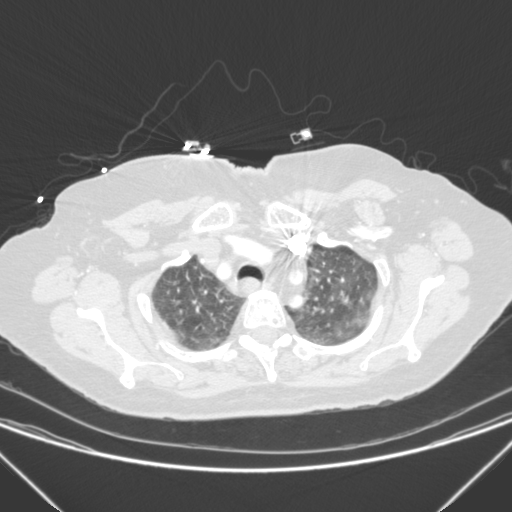
[im 235/265  mediastinal]
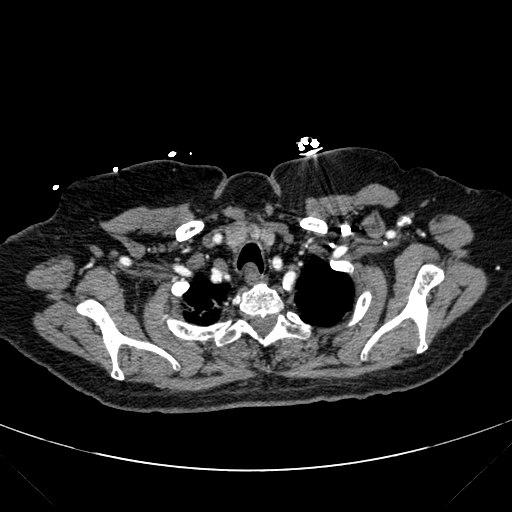
[im 250/265  lung]
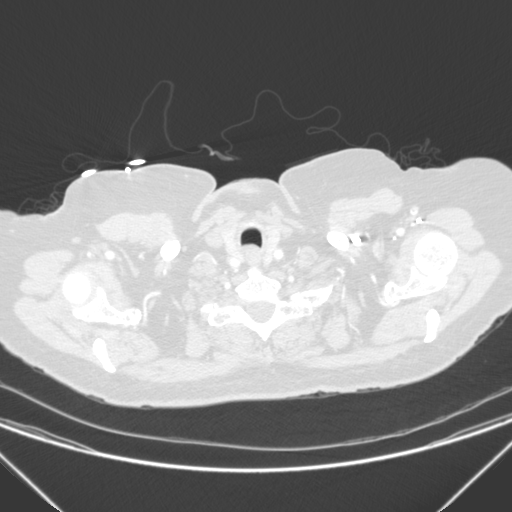

[17 of 36 positions shown; findings below may reference images not displayed]

FINDINGS: Mediastinum/Nodes: Postsurgical changes left axilla. Heart is
enlarged. Small pericardial effusion. There is a 13 mm prevascular
lymph node (image 43; series 41). No hilar lymphadenopathy. Small
hiatal hernia.

Adequate opacification of the pulmonary artery. Extensive motion
artifact markedly limits evaluation. No evidence for central, main
or lobar pulmonary emboli. Evaluation the more distal pulmonary
arteries nondiagnostic due to motion artifact.

Lungs/Pleura: Expiratory phase imaging. Central airways are patent.
Motion artifact involving the lower lobes bilaterally. Ground-glass
opacities within the bilateral lower lobes. No large area of
pulmonary consolidation. Lingular atelectasis. There is a 9 mm right
middle lobe nodule along the fissure (image 115; series 407). There
is an additional adjacent subpleural 9 mm nodule within the right
middle lobe (image 123; series 407). 4 mm calcified granuloma left
upper lobe. No pleural effusion or pneumothorax.

Upper abdomen: Liver is diffusely low in attenuation compatible with
steatosis. The adrenal glands are normal. Small hiatal hernia.

Musculoskeletal: No aggressive or acute appearing osseous lesions.
Thoracic spine degenerative changes. Postsurgical changes involving
the left breast and left axilla.

Review of the MIP images confirms the above findings.
IMPRESSION: Limited exam due to motion artifact. No evidence for central, main
or lobar pulmonary emboli. Evaluation of the more distal pulmonary
arteries nondiagnostic due to motion artifact.

There is an indeterminate 1.3 cm prevascular mass which may
represent an enlarged lymph node, potentially infectious or
inflammatory in etiology. Given history of breast cancer, metastatic
disease cannot be excluded. Consider dedicated evaluation with
PET-CT or short-term follow-up as clinically indicated (3 months).

Indeterminate pulmonary nodules predominately within the right
middle lobe measuring up to 9 mm. These may be infectious or
inflammatory in etiology. Recommend follow-up chest CT in 3 months
to assess for interval persistence and/or resolution.

Small pericardial effusion.

Hepatic steatosis.

## 2018-11-04 DIAGNOSIS — Z8719 Personal history of other diseases of the digestive system: Secondary | ICD-10-CM | POA: Insufficient documentation

## 2019-05-07 DIAGNOSIS — R55 Syncope and collapse: Secondary | ICD-10-CM | POA: Insufficient documentation

## 2019-05-14 DIAGNOSIS — E538 Deficiency of other specified B group vitamins: Secondary | ICD-10-CM | POA: Insufficient documentation

## 2019-06-04 DIAGNOSIS — D649 Anemia, unspecified: Secondary | ICD-10-CM | POA: Insufficient documentation

## 2019-06-04 NOTE — Progress Notes (Signed)
[place]  Telephone:(336) [phone] Fax:(336) [phone]  ID: ORTENCIA ASKARI OB: Jan 22, 1941  MR#: [id number]  AOZ#:[id number]  Patient Care Team: Alvera Singh, FNP as PCP - General (Family Medicine)  CHIEF COMPLAINT: Anemia, unspecified.  INTERVAL HISTORY: Patient is 79 year old female who reports a longstanding history of anemia and is referred for further evaluation.  She has chronic weakness and fatigue, but otherwise feels well.  She has no neurologic complaints.  She denies any recent fevers or illnesses.  She has a good appetite and denies weight loss.  She has no chest pain, shortness of breath, cough, or hemoptysis.  She denies any nausea, vomiting, constipation, or diarrhea.  She has no melena or hematochezia.  She has no urinary complaints.  Patient feels at her baseline offers no further specific complaints today.  REVIEW OF SYSTEMS:   Review of Systems  Constitutional: Positive for malaise/fatigue. Negative for fever and weight loss.  Respiratory: Negative.  Negative for cough, hemoptysis and shortness of breath.   Cardiovascular: Negative.  Negative for chest pain and leg swelling.  Gastrointestinal: Negative.  Negative for abdominal pain, blood in stool and melena.  Genitourinary: Negative.  Negative for dysuria and hematuria.  Musculoskeletal: Negative.  Negative for back pain.  Skin: Negative.  Negative for rash.  Neurological: Positive for weakness. Negative for dizziness, focal weakness and headaches.  Psychiatric/Behavioral: Negative.  The patient is not nervous/anxious.     As per HPI. Otherwise, a complete review of systems is negative.  PAST MEDICAL HISTORY: Past Medical History:  Diagnosis Date  . Diabetes mellitus type II, controlled ([place])   . History of left breast cancer   . HLD (hyperlipidemia)   . Hypertension   . Iron deficiency anemia   . Pulmonary nodules   . Vitamin B12 deficiency     PAST SURGICAL HISTORY: Past Surgical  History:  Procedure Laterality Date  . HIP SURGERY Left   . MASTECTOMY PARTIAL / LUMPECTOMY Left     FAMILY HISTORY: Family History  Problem Relation Age of Onset  . Diabetes Mellitus II Mother   . Cancer - Lung Father   . Diabetes Mellitus II Sister   . Liver disease Sister   . Cancer - Lung Brother     ADVANCED DIRECTIVES (Y/N):  N  HEALTH MAINTENANCE: Social History   Tobacco Use  . Smoking status: Never Smoker  . Smokeless tobacco: Never Used  Substance Use Topics  . Alcohol use: Never    Alcohol/week: 0.0 standard drinks  . Drug use: Not on file     Colonoscopy:  PAP:  Bone density:  Lipid panel:  No Known Allergies  Current Outpatient Medications  Medication Sig Dispense Refill  . amLODipine (NORVASC) 5 MG tablet Take 5 mg by mouth daily. Reported on 07/31/2015    . aspirin EC 81 MG tablet Take 81 mg by mouth daily.    . calcium-vitamin D (OSCAL WITH D) 500-200 MG-UNIT tablet Take 1 tablet by mouth daily with breakfast.    . cyanocobalamin (,VITAMIN B-12,) 1000 MCG/ML injection Inject 1,000 mcg into the muscle every 30 (thirty) days.    . ferrous sulfate 325 (65 FE) MG tablet Take 325 mg by mouth daily with breakfast.    . lisinopril (PRINIVIL,ZESTRIL) 20 MG tablet Take 20 mg by mouth daily.    . metFORMIN (GLUCOPHAGE) 850 MG tablet Take 1 tablet by mouth 2 (two) times daily after a meal.    . Multiple Vitamins-Minerals (VISION FORMULA PO) Take by  mouth.    . omeprazole (PRILOSEC) 40 MG capsule Take 40 mg by mouth daily.    . pioglitazone (ACTOS) 15 MG tablet Take 1 tablet by mouth daily.    . simvastatin (ZOCOR) 80 MG tablet Take 80 mg by mouth at bedtime.    . sitaGLIPtin (JANUVIA) 100 MG tablet Take 100 mg by mouth daily.     No current facility-administered medications for this visit.    OBJECTIVE: Vitals:   06/08/19 1505  BP: (!) 153/63  Pulse: 85  Temp: 98.7 F (37.1 C)     Body mass index is 25.51 kg/m.    ECOG FS:0 - Asymptomatic  General:  Well-developed, well-nourished, no acute distress. Eyes: Pink conjunctiva, anicteric sclera. HEENT: Normocephalic, moist mucous membranes. Lungs: No audible wheezing or coughing. Heart: Regular rate and rhythm. Abdomen: Soft, nontender, no obvious distention. Musculoskeletal: No edema, cyanosis, or clubbing. Neuro: Alert, answering all questions appropriately. Cranial nerves grossly intact. Skin: No rashes or petechiae noted. Psych: Normal affect. Lymphatics: No cervical, calvicular, axillary or inguinal LAD.   LAB RESULTS:  Lab Results  Component Value Date   NA 139 07/25/2015   K 4.0 07/25/2015   CL 101 07/25/2015   CO2 29 07/25/2015   GLUCOSE 130 (H) 07/25/2015   BUN 8 07/25/2015   CREATININE 0.69 07/25/2015   CALCIUM 10.0 07/25/2015   PROT 7.8 10/24/2009   ALBUMIN 4.6 10/24/2009   AST 19 10/24/2009   ALT 17 10/24/2009   ALKPHOS 72 10/24/2009   BILITOT 0.5 10/24/2009   GFRNONAA >60 05/03/2015   GFRAA >60 05/03/2015    Lab Results  Component Value Date   WBC 4.3 06/08/2019   NEUTROABS 6.9 05/03/2015   HGB 11.9 (L) 06/08/2019   HCT 38.2 06/08/2019   MCV 89.0 06/08/2019   PLT 237 06/08/2019     STUDIES: No results found.  ASSESSMENT: Anemia, unspecified.  PLAN:    1. Anemia, unspecified: Patient's hemoglobin has improved to 11.9 and is now nearly within normal limits.  Reticulocyte count is appropriately normal.  All of her other laboratory work including hemolysis labs, iron panel, B12, SPEP and folate are all pending at time of dictation.  Patient has been instructed to continue her oral iron supplementation as well as her monthly B12 injections.  No further intervention is needed at this time.  Patient does not require bone marrow biopsy.  She will have video assisted telemedicine visit in 3 weeks for further evaluation and discussion of her results.  I spent a total of 45 minutes reviewing chart data, face-to-face evaluation with the patient, counseling and  coordination of care as detailed above.   Patient expressed understanding and was in agreement with this plan. She also understands that She can call clinic at any time with any questions, concerns, or complaints.    Lloyd Huger, MD   06/08/2019 4:22 PM

## 2019-06-08 ENCOUNTER — Other Ambulatory Visit: Payer: Self-pay

## 2019-06-08 ENCOUNTER — Inpatient Hospital Stay: Payer: Medicare HMO | Attending: Oncology | Admitting: Oncology

## 2019-06-08 ENCOUNTER — Encounter: Payer: Self-pay | Admitting: Oncology

## 2019-06-08 ENCOUNTER — Inpatient Hospital Stay: Payer: Medicare HMO

## 2019-06-08 DIAGNOSIS — Z9012 Acquired absence of left breast and nipple: Secondary | ICD-10-CM | POA: Diagnosis not present

## 2019-06-08 DIAGNOSIS — R5383 Other fatigue: Secondary | ICD-10-CM | POA: Diagnosis not present

## 2019-06-08 DIAGNOSIS — Z801 Family history of malignant neoplasm of trachea, bronchus and lung: Secondary | ICD-10-CM | POA: Insufficient documentation

## 2019-06-08 DIAGNOSIS — R531 Weakness: Secondary | ICD-10-CM | POA: Diagnosis not present

## 2019-06-08 DIAGNOSIS — Z853 Personal history of malignant neoplasm of breast: Secondary | ICD-10-CM | POA: Insufficient documentation

## 2019-06-08 DIAGNOSIS — D649 Anemia, unspecified: Secondary | ICD-10-CM | POA: Diagnosis not present

## 2019-06-08 LAB — CBC
HCT: 38.2 % (ref 36.0–46.0)
Hemoglobin: 11.9 g/dL — ABNORMAL LOW (ref 12.0–15.0)
MCH: 27.7 pg (ref 26.0–34.0)
MCHC: 31.2 g/dL (ref 30.0–36.0)
MCV: 89 fL (ref 80.0–100.0)
Platelets: 237 10*3/uL (ref 150–400)
RBC: 4.29 MIL/uL (ref 3.87–5.11)
RDW: 21.3 % — ABNORMAL HIGH (ref 11.5–15.5)
WBC: 4.3 10*3/uL (ref 4.0–10.5)
nRBC: 0 % (ref 0.0–0.2)

## 2019-06-08 LAB — IRON AND TIBC
Iron: 43 ug/dL (ref 28–170)
Saturation Ratios: 11 % (ref 10.4–31.8)
TIBC: 389 ug/dL (ref 250–450)
UIBC: 346 ug/dL

## 2019-06-08 LAB — FERRITIN: Ferritin: 39 ng/mL (ref 11–307)

## 2019-06-08 LAB — RETICULOCYTES
Immature Retic Fract: 15.6 % (ref 2.3–15.9)
RBC.: 4.24 MIL/uL (ref 3.87–5.11)
Retic Count, Absolute: 92.9 10*3/uL (ref 19.0–186.0)
Retic Ct Pct: 2.2 % (ref 0.4–3.1)

## 2019-06-08 LAB — VITAMIN B12: Vitamin B-12: 246 pg/mL (ref 180–914)

## 2019-06-08 LAB — LACTATE DEHYDROGENASE: LDH: 140 U/L (ref 98–192)

## 2019-06-08 LAB — DAT, POLYSPECIFIC AHG (ARMC ONLY): Polyspecific AHG test: NEGATIVE

## 2019-06-08 NOTE — Progress Notes (Signed)
Patient stated that she is currently tired and fatigued. Patient is here today to establish care for iron deficiency anemia.

## 2019-06-09 LAB — PROTEIN ELECTROPHORESIS, SERUM
A/G Ratio: 1.1 (ref 0.7–1.7)
Albumin ELP: 3.3 g/dL (ref 2.9–4.4)
Alpha-1-Globulin: 0.2 g/dL (ref 0.0–0.4)
Alpha-2-Globulin: 1 g/dL (ref 0.4–1.0)
Beta Globulin: 1.1 g/dL (ref 0.7–1.3)
Gamma Globulin: 0.8 g/dL (ref 0.4–1.8)
Globulin, Total: 3.1 g/dL (ref 2.2–3.9)
Total Protein ELP: 6.4 g/dL (ref 6.0–8.5)

## 2019-06-09 LAB — THYROID PANEL WITH TSH
Free Thyroxine Index: 1.7 (ref 1.2–4.9)
T3 Uptake Ratio: 25 % (ref 24–39)
T4, Total: 6.9 ug/dL (ref 4.5–12.0)
TSH: 2.05 u[IU]/mL (ref 0.450–4.500)

## 2019-06-09 LAB — HAPTOGLOBIN: Haptoglobin: 159 mg/dL (ref 42–346)

## 2019-06-09 LAB — ERYTHROPOIETIN: Erythropoietin: 12.7 m[IU]/mL (ref 2.6–18.5)

## 2019-06-29 NOTE — Progress Notes (Signed)
[place]  Telephone:(336) [phone] Fax:(336) [phone]  ID: ERALYNN TEWOLDE OB: 11/04/1940  MR#: [id number]  [id number]  Patient Care Team: Alvera Singh, FNP as PCP - General (Family Medicine)  I connected with Staci Acosta on 07/05/19 at  2:30 PM EDT by video enabled telemedicine visit and verified that I am speaking with the correct person using two identifiers.   I discussed the limitations, risks, security and privacy concerns of performing an evaluation and management service by telemedicine and the availability of in-person appointments. I also discussed with the patient that there may be a patient responsible charge related to this service. The patient expressed understanding and agreed to proceed.   Other persons participating in the visit and their role in the encounter: Patient, MD.  Patient's location: Home. Provider's location: Clinic.  CHIEF COMPLAINT: Anemia, unspecified.  INTERVAL HISTORY: Patient agreed to video enabled telemedicine visit for further evaluation and discussion of her laboratory work.  She continues to have mild chronic weakness and fatigue, but otherwise feels well.  She has no neurologic complaints.  She denies any recent fevers or illnesses.  She has a good appetite and denies weight loss.  She has no chest pain, shortness of breath, cough, or hemoptysis.  She denies any nausea, vomiting, constipation, or diarrhea.  She has no melena or hematochezia.  She has no urinary complaints.  Patient offers no further specific complaints today.  REVIEW OF SYSTEMS:   Review of Systems  Constitutional: Positive for malaise/fatigue. Negative for fever and weight loss.  Respiratory: Negative.  Negative for cough, hemoptysis and shortness of breath.   Cardiovascular: Negative.  Negative for chest pain and leg swelling.  Gastrointestinal: Negative.  Negative for abdominal pain, blood in stool and melena.  Genitourinary: Negative.   Negative for dysuria and hematuria.  Musculoskeletal: Negative.  Negative for back pain.  Skin: Negative.  Negative for rash.  Neurological: Positive for weakness. Negative for dizziness, focal weakness and headaches.  Psychiatric/Behavioral: Negative.  The patient is not nervous/anxious.     As per HPI. Otherwise, a complete review of systems is negative.  PAST MEDICAL HISTORY: Past Medical History:  Diagnosis Date  . Diabetes mellitus type II, controlled ([place])   . History of left breast cancer   . HLD (hyperlipidemia)   . Hypertension   . Iron deficiency anemia   . Pulmonary nodules   . Vitamin B12 deficiency     PAST SURGICAL HISTORY: Past Surgical History:  Procedure Laterality Date  . HIP SURGERY Left   . MASTECTOMY PARTIAL / LUMPECTOMY Left     FAMILY HISTORY: Family History  Problem Relation Age of Onset  . Diabetes Mellitus II Mother   . Cancer - Lung Father   . Diabetes Mellitus II Sister   . Liver disease Sister   . Cancer - Lung Brother     ADVANCED DIRECTIVES (Y/N):  N  HEALTH MAINTENANCE: Social History   Tobacco Use  . Smoking status: Never Smoker  . Smokeless tobacco: Never Used  Substance Use Topics  . Alcohol use: Never    Alcohol/week: 0.0 standard drinks  . Drug use: Not on file     Colonoscopy:  PAP:  Bone density:  Lipid panel:  No Known Allergies  Current Outpatient Medications  Medication Sig Dispense Refill  . aspirin EC 81 MG tablet Take 81 mg by mouth daily.    . calcium-vitamin D (OSCAL WITH D) 500-200 MG-UNIT tablet Take 1 tablet by mouth daily with  breakfast.    . cyanocobalamin (,VITAMIN B-12,) 1000 MCG/ML injection Inject 1,000 mcg into the muscle every 30 (thirty) days.    . ferrous sulfate 325 (65 FE) MG tablet Take 325 mg by mouth daily with breakfast.    . lisinopril (PRINIVIL,ZESTRIL) 20 MG tablet Take 20 mg by mouth daily.    . metFORMIN (GLUCOPHAGE) 850 MG tablet Take 1 tablet by mouth 2 (two) times daily after a  meal.    . Multiple Vitamins-Minerals (VISION FORMULA PO) Take by mouth.    Marland Kitchen omeprazole (PRILOSEC) 40 MG capsule Take 40 mg by mouth daily.    . pioglitazone (ACTOS) 15 MG tablet Take 1 tablet by mouth daily.    . simvastatin (ZOCOR) 80 MG tablet Take 80 mg by mouth at bedtime.    . sitaGLIPtin (JANUVIA) 100 MG tablet Take 100 mg by mouth daily.    Marland Kitchen amLODipine (NORVASC) 5 MG tablet Take 5 mg by mouth daily. Reported on 07/31/2015     No current facility-administered medications for this visit.    OBJECTIVE: There were no vitals filed for this visit.   There is no height or weight on file to calculate BMI.    ECOG FS:0 - Asymptomatic  General: Well-developed, well-nourished, no acute distress. HEENT: Normocephalic. Neuro: Alert, answering all questions appropriately. Cranial nerves grossly intact. Psych: Normal affect.  LAB RESULTS:  Lab Results  Component Value Date   NA 139 07/25/2015   K 4.0 07/25/2015   CL 101 07/25/2015   CO2 29 07/25/2015   GLUCOSE 130 (H) 07/25/2015   BUN 8 07/25/2015   CREATININE 0.69 07/25/2015   CALCIUM 10.0 07/25/2015   PROT 7.8 10/24/2009   ALBUMIN 4.6 10/24/2009   AST 19 10/24/2009   ALT 17 10/24/2009   ALKPHOS 72 10/24/2009   BILITOT 0.5 10/24/2009   GFRNONAA >60 05/03/2015   GFRAA >60 05/03/2015    Lab Results  Component Value Date   WBC 4.3 06/08/2019   NEUTROABS 6.9 05/03/2015   HGB 11.9 (L) 06/08/2019   HCT 38.2 06/08/2019   MCV 89.0 06/08/2019   PLT 237 06/08/2019     STUDIES: No results found.  ASSESSMENT: Anemia, unspecified.  PLAN:    1. Anemia, unspecified: Patient's hemoglobin has improved to 11.9 and is now nearly within normal limits.  Reticulocyte count is appropriately normal.  All of her other laboratory work including hemolysis labs, iron panel, B12, SPEP and folate are all either negative or within normal limits as well.  Patient has been instructed to continue her oral iron supplementation as well as her  monthly B12 injections.  No intervention is needed at this time.  Return to clinic in 6 months with repeat laboratory work and video assisted telemedicine visit.  If everything continues to remain stable, patient can likely be discharged from clinic.  I provided 20 minutes of face-to-face video visit time during this encounter which included chart review, counseling, and coordination of care as documented above.   Patient expressed understanding and was in agreement with this plan. She also understands that She can call clinic at any time with any questions, concerns, or complaints.    Lloyd Huger, MD   07/05/2019 1:38 PM

## 2019-07-04 ENCOUNTER — Other Ambulatory Visit: Payer: Self-pay

## 2019-07-04 ENCOUNTER — Inpatient Hospital Stay: Payer: Medicare HMO | Attending: Oncology | Admitting: Oncology

## 2019-07-04 ENCOUNTER — Encounter: Payer: Self-pay | Admitting: Oncology

## 2019-07-04 DIAGNOSIS — D649 Anemia, unspecified: Secondary | ICD-10-CM | POA: Diagnosis not present

## 2019-12-28 NOTE — Progress Notes (Deleted)
[place]  Telephone:(336) [phone] Fax:(336) [phone]  ID: Amber Walls OB: 1940/08/11  MR#: [id number]  [id number]#:[id number]  Patient Care Team: Alvera Singh, Cole as PCP - General (Family Medicine)  I connected with Staci Acosta on 12/28/19 at  1:45 PM EDT by {Blank single:19197::"video enabled telemedicine visit","telephone visit"} and verified that I am speaking with the correct person using two identifiers.   I discussed the limitations, risks, security and privacy concerns of performing an evaluation and management service by telemedicine and the availability of in-person appointments. I also discussed with the patient that there may be a patient responsible charge related to this service. The patient expressed understanding and agreed to proceed.   Other persons participating in the visit and their role in the encounter: Patient, MD.  Patient's location: Home. Provider's location: Clinic.  CHIEF COMPLAINT: Anemia, unspecified.  INTERVAL HISTORY: Patient agreed to video enabled telemedicine visit for further evaluation and discussion of her laboratory work.  She continues to have mild chronic weakness and fatigue, but otherwise feels well.  She has no neurologic complaints.  She denies any recent fevers or illnesses.  She has a good appetite and denies weight loss.  She has no chest pain, shortness of breath, cough, or hemoptysis.  She denies any nausea, vomiting, constipation, or diarrhea.  She has no melena or hematochezia.  She has no urinary complaints.  Patient offers no further specific complaints today.  REVIEW OF SYSTEMS:   Review of Systems  Constitutional: Positive for malaise/fatigue. Negative for fever and weight loss.  Respiratory: Negative.  Negative for cough, hemoptysis and shortness of breath.   Cardiovascular: Negative.  Negative for chest pain and leg swelling.  Gastrointestinal: Negative.  Negative for abdominal pain, blood in stool and  melena.  Genitourinary: Negative.  Negative for dysuria and hematuria.  Musculoskeletal: Negative.  Negative for back pain.  Skin: Negative.  Negative for rash.  Neurological: Positive for weakness. Negative for dizziness, focal weakness and headaches.  Psychiatric/Behavioral: Negative.  The patient is not nervous/anxious.     As per HPI. Otherwise, a complete review of systems is negative.  PAST MEDICAL HISTORY: Past Medical History:  Diagnosis Date  . Diabetes mellitus type II, controlled ([place])   . History of left breast cancer   . HLD (hyperlipidemia)   . Hypertension   . Iron deficiency anemia   . Pulmonary nodules   . Vitamin B12 deficiency     PAST SURGICAL HISTORY: Past Surgical History:  Procedure Laterality Date  . HIP SURGERY Left   . MASTECTOMY PARTIAL / LUMPECTOMY Left     FAMILY HISTORY: Family History  Problem Relation Age of Onset  . Diabetes Mellitus II Mother   . Cancer - Lung Father   . Diabetes Mellitus II Sister   . Liver disease Sister   . Cancer - Lung Brother     ADVANCED DIRECTIVES (Y/N):  N  HEALTH MAINTENANCE: Social History   Tobacco Use  . Smoking status: Never Smoker  . Smokeless tobacco: Never Used  Substance Use Topics  . Alcohol use: Never    Alcohol/week: 0.0 standard drinks  . Drug use: Not on file     Colonoscopy:  PAP:  Bone density:  Lipid panel:  No Known Allergies  Current Outpatient Medications  Medication Sig Dispense Refill  . amLODipine (NORVASC) 5 MG tablet Take 5 mg by mouth daily. Reported on 07/31/2015    . aspirin EC 81 MG tablet Take 81 mg by mouth  daily.    . calcium-vitamin D (OSCAL WITH D) 500-200 MG-UNIT tablet Take 1 tablet by mouth daily with breakfast.    . cyanocobalamin (,VITAMIN B-12,) 1000 MCG/ML injection Inject 1,000 mcg into the muscle every 30 (thirty) days.    . ferrous sulfate 325 (65 FE) MG tablet Take 325 mg by mouth daily with breakfast.    . lisinopril (PRINIVIL,ZESTRIL) 20 MG tablet  Take 20 mg by mouth daily.    . metFORMIN (GLUCOPHAGE) 850 MG tablet Take 1 tablet by mouth 2 (two) times daily after a meal.    . Multiple Vitamins-Minerals (VISION FORMULA PO) Take by mouth.    Marland Kitchen omeprazole (PRILOSEC) 40 MG capsule Take 40 mg by mouth daily.    . pioglitazone (ACTOS) 15 MG tablet Take 1 tablet by mouth daily.    . simvastatin (ZOCOR) 80 MG tablet Take 80 mg by mouth at bedtime.    . sitaGLIPtin (JANUVIA) 100 MG tablet Take 100 mg by mouth daily.     No current facility-administered medications for this visit.    OBJECTIVE: There were no vitals filed for this visit.   There is no height or weight on file to calculate BMI.    ECOG FS:0 - Asymptomatic  General: Well-developed, well-nourished, no acute distress. HEENT: Normocephalic. Neuro: Alert, answering all questions appropriately. Cranial nerves grossly intact. Psych: Normal affect.  LAB RESULTS:  Lab Results  Component Value Date   NA 139 07/25/2015   K 4.0 07/25/2015   CL 101 07/25/2015   CO2 29 07/25/2015   GLUCOSE 130 (H) 07/25/2015   BUN 8 07/25/2015   CREATININE 0.69 07/25/2015   CALCIUM 10.0 07/25/2015   PROT 7.8 10/24/2009   ALBUMIN 4.6 10/24/2009   AST 19 10/24/2009   ALT 17 10/24/2009   ALKPHOS 72 10/24/2009   BILITOT 0.5 10/24/2009   GFRNONAA >60 05/03/2015   GFRAA >60 05/03/2015    Lab Results  Component Value Date   WBC 4.3 06/08/2019   NEUTROABS 6.9 05/03/2015   HGB 11.9 (L) 06/08/2019   HCT 38.2 06/08/2019   MCV 89.0 06/08/2019   PLT 237 06/08/2019     STUDIES: No results found.  ASSESSMENT: Anemia, unspecified.  PLAN:    1. Anemia, unspecified: Patient's hemoglobin has improved to 11.9 and is now nearly within normal limits.  Reticulocyte count is appropriately normal.  All of her other laboratory work including hemolysis labs, iron panel, B12, SPEP and folate are all either negative or within normal limits as well.  Patient has been instructed to continue her oral iron  supplementation as well as her monthly B12 injections.  No intervention is needed at this time.  Return to clinic in 6 months with repeat laboratory work and video assisted telemedicine visit.  If everything continues to remain stable, patient can likely be discharged from clinic.  I provided *** minutes of {Blank single:19197::"face-to-face video visit time","non face-to-face telephone visit time"} during this encounter which included chart review, counseling, and coordination of care as documented above.   Patient expressed understanding and was in agreement with this plan. She also understands that She can call clinic at any time with any questions, concerns, or complaints.    Lloyd Huger, MD   12/28/2019 12:25 PM

## 2020-01-01 ENCOUNTER — Inpatient Hospital Stay: Payer: Medicare HMO

## 2020-01-02 ENCOUNTER — Inpatient Hospital Stay: Payer: Medicare HMO | Admitting: Oncology

## 2023-05-07 DIAGNOSIS — I10 Essential (primary) hypertension: Secondary | ICD-10-CM | POA: Diagnosis not present

## 2023-05-07 DIAGNOSIS — D649 Anemia, unspecified: Secondary | ICD-10-CM | POA: Diagnosis not present

## 2023-05-07 DIAGNOSIS — E785 Hyperlipidemia, unspecified: Secondary | ICD-10-CM | POA: Diagnosis not present

## 2023-05-07 DIAGNOSIS — E118 Type 2 diabetes mellitus with unspecified complications: Secondary | ICD-10-CM | POA: Diagnosis not present

## 2023-05-07 NOTE — Progress Notes (Signed)
 Assessment and Plan:   Amber Walls was seen today for hyperlipidemia, diabetes, joint swelling, health maintenance and anemia.  Diagnoses and all orders for this visit:  Type 2 diabetes mellitus with complication, without long-term current use of insulin (CMS-HCC) -     POCT glycosylated hemoglobin (Hb A1C) -     CBC w/ Differential -     Basic Metabolic Panel -     Hepatic Function Panel -     Lipid Panel -A1C 6.8% -ACTOS 15 mg daily -On statin, aspirin, ACE  Anemia, unspecified type -     CBC w/ Differential -     Basic Metabolic Panel -     Iron & TIBC -     Ferritin -     Folate Level -     Vitamin B12 Level -     Reticulocytes -S/p 5 transfusions -Missed last Hematology appointment due to transportation issues -Will check lab work today  Hypertension, unspecified type BP controlled Lisinopril 20 mg daily  Norvasc 5 mg daily   Hyperlipidemia, unspecified hyperlipidemia type Crestor 10 mg daily     Current Outpatient Medications:  .  ACCU-CHEK GUIDE L1-L2 CTRL SOL Soln, , Disp: , Rfl:  .  alcohol swabs (DROPSAFE ALCOHOL PREP PADS) PadM, USE AS DIRECTED, Disp: 300 each, Rfl: 0 .  amlodipine (NORVASC) 5 MG tablet, TAKE 1 TABLET EVERY DAY, Disp: 90 tablet, Rfl: 3 .  ascorbic acid, vitamin C, (VITAMIN C) 500 MG tablet, Take 1 tablet (500 mg total) by mouth daily., Disp: , Rfl:  .  aspirin (ECOTRIN) 81 MG tablet, Take 1 tablet (81 mg total) by mouth daily., Disp: , Rfl:  .  blood sugar diagnostic (GLUCOSE BLOOD) Strp, Accu-check test strip ,3 month supply , check blood sugar three times day, Disp: 200 strip, Rfl: 1 .  cyanocobalamin , vitamin B-12, (VITAMIN B-12) 1000 MCG tablet, Take 1 tablet (1,000 mcg total) by mouth daily., Disp: , Rfl:  .  ferrous sulfate 325 (65 FE) MG tablet, Take 1 tablet (325 mg total) by mouth daily before breakfast., Disp: , Rfl:  .  gabapentin (NEURONTIN) 100 MG capsule, TAKE 1 CAPSULE THREE TIMES DAILY, Disp: 270 capsule, Rfl: 1 .  JANUVIA 50 mg  tablet, TAKE 1 TABLET (50 MG TOTAL) BY MOUTH DAILY., Disp: 90 tablet, Rfl: 1 .  lancets (ACCU-CHEK SOFTCLIX LANCETS) Misc, TEST BLOOD SUGAR EVERY DAY, Disp: 100 each, Rfl: 3 .  lisinopril (PRINIVIL,ZESTRIL) 20 MG tablet, TAKE 1 TABLET EVERY DAY (NEED MD APPOINTMENT), Disp: 90 tablet, Rfl: 3 .  MEDICAL SUPPLY ITEM, One touch Derio test strips and lancets To be used to check glucose daily, Disp: 100 each, Rfl: prn .  metFORMIN (GLUCOPHAGE) 1000 MG tablet, TAKE 1 TABLET IN THE MORNING AND 1 TABLET IN THE EVENING. TAKE WITH MEALS., Disp: 180 tablet, Rfl: 1 .  miscellaneous medical supply Misc, Alcohol swabs use as directed, Disp: 100 each, Rfl: 1 .  miscellaneous medical supply Misc, Accu-Chek Guide L1-L2 control solution, Disp: 1 each, Rfl: 0 .  miscellaneous medical supply Misc, Blood pressure monitor Check BP 2 to 3 days a week Diagnosis:  Hypertension, Disp: 1 each, Rfl: 0 .  omega-3 acid ethyl esters (LOVAZA) 1 gram capsule, Take 2 capsules (2 g total) by mouth daily. Not daily, Disp: , Rfl:  .  OMEGA-3/DHA/EPA/FISH OIL (FISH OIL-OMEGA-3 FATTY ACIDS) 300-1,000 mg capsule, Take 2 capsules (2 g total) by mouth daily., Disp: , Rfl:  .  omeprazole (PRILOSEC) 40 MG capsule, TAKE  1 CAPSULE EVERY DAY, Disp: 90 capsule, Rfl: 3 .  pioglitazone (ACTOS) 15 MG tablet, TAKE 1 TABLET TWICE DAILY, Disp: 117 tablet, Rfl: 5 .  rosuvastatin (CRESTOR) 10 MG tablet, TAKE 1 TABLET EVERY DAY, Disp: 90 tablet, Rfl: 3 .  blood-glucose meter kit, Accu-Check -use to check blood sugars once a day, Disp: 1 each, Rfl: 0 .  calcium-vitamin D  500 mg-5 mcg (200 unit) per tablet, Take 1 tablet by mouth daily. (Patient not taking: Reported on 05/07/2023), Disp: , Rfl:  .  vits A,C,E-lutein-zeax-zn-copp 9,650 unit-195 mg-95 unit cap, Take 1 capsule by mouth daily. (Patient not taking: Reported on 05/07/2023), Disp: , Rfl:   Current Facility-Administered Medications:  .  cyanocobalamin  (vitamin B-12) injection 1,000 mcg, 1,000 mcg,  Intramuscular, Q30 Days, Dolleschel, Damien Croak, FNP, 1,000 mcg at 06/26/21 1104  Discussed prescriptions noted above, including appropriate use, potential side effects, drug interactions, and the consequences of not taking it.  Appropriate patient education materials provided as indicated. Patient verbalized an understanding of today's assessment and recommendations  Subjective:    Amber Walls is a 83 y.o. female.   Amber Walls is a 83 y.o. female with PMHx of anemia, anxiety, T2DM, GERD, heart murmur, breast cancer, syncope, HLD, and HTN presents for routine follow up.  Tries to walk some.      Anemia:  Last Hgb 11.9. S/p 5 iron infusion.  Missed her last Hematology appointment due to transportation.     Diabetes:  Hgb A1C 6.8.  Currently on Metformin 1000/1000, Actos 15 mg daily, and Januvia.  Home sugars cannot remember (checks them on occasion).  Needs to schedule retinal eye exam.     HTN - Lisinopril 20 mg daily, Norvasc 5 mg daily.  Home readings are at goal around 130/80.     Hyperlipidemia:  Crestor 10 mg daily.  No s/e of the medication.      The following portions of the patietns history were reviewed and updated as appropriate: Allergies, current meds, past medical history, past family history, past social history, past surgical history, and problem list.   ROS:   A 12 system review of systems was negative except as noted in HPI.  Vital Signs:   Wt Readings from Last 3 Encounters:  05/07/23 68.5 kg (151 lb)  01/15/23 68.3 kg (150 lb 8 oz)  07/30/22 66.7 kg (147 lb)   Temp Readings from Last 3 Encounters:  05/07/23 36.6 C (97.9 F) (Oral)  01/15/23 36.7 C (98.1 F) (Oral)  07/30/22 36.7 C (98.1 F) (Oral)   BP Readings from Last 3 Encounters:  05/07/23 134/60  01/15/23 138/56  07/30/22 130/72   Pulse Readings from Last 3 Encounters:  05/07/23 76  01/15/23 85  07/30/22 74   Estimated body mass index is 26.42 kg/m as calculated from the  following:   Height as of this encounter: 161 cm (5' 3.39).   Weight as of this encounter: 68.5 kg (151 lb). Facility age limit for growth %iles is 20 years.    Objective:   Physical Exam Constitutional:      Appearance: She is well-developed.  HENT:     Head: Normocephalic and atraumatic.     Left Ear: External ear normal.  Eyes:     Conjunctiva/sclera: Conjunctivae normal.  Cardiovascular:     Rate and Rhythm: Normal rate and regular rhythm.     Heart sounds: Normal heart sounds.  Pulmonary:     Effort: Pulmonary effort is normal.  Breath sounds: Normal breath sounds.  Abdominal:     General: Bowel sounds are normal. There is no distension.     Palpations: Abdomen is soft.     Tenderness: There is no abdominal tenderness.  Musculoskeletal:        General: Normal range of motion.     Cervical back: Neck supple.  Skin:    General: Skin is warm and dry.  Neurological:     Mental Status: She is alert and oriented to person, place, and time.  Psychiatric:        Behavior: Behavior normal.        Thought Content: Thought content normal.        Judgment: Judgment normal.

## 2023-05-07 NOTE — Progress Notes (Signed)
 Tried calling patient no answer.

## 2023-05-07 NOTE — Progress Notes (Signed)
 Anemia has improved  Still waiting on ferritin level

## 2023-08-05 DIAGNOSIS — M79651 Pain in right thigh: Secondary | ICD-10-CM | POA: Diagnosis not present

## 2023-08-05 DIAGNOSIS — I10 Essential (primary) hypertension: Secondary | ICD-10-CM | POA: Diagnosis not present

## 2023-08-05 DIAGNOSIS — D649 Anemia, unspecified: Secondary | ICD-10-CM | POA: Diagnosis not present

## 2023-08-05 DIAGNOSIS — E119 Type 2 diabetes mellitus without complications: Secondary | ICD-10-CM | POA: Diagnosis not present

## 2023-08-05 DIAGNOSIS — E1151 Type 2 diabetes mellitus with diabetic peripheral angiopathy without gangrene: Secondary | ICD-10-CM | POA: Diagnosis not present

## 2023-08-05 DIAGNOSIS — M79661 Pain in right lower leg: Secondary | ICD-10-CM | POA: Diagnosis not present

## 2023-08-05 DIAGNOSIS — M1711 Unilateral primary osteoarthritis, right knee: Secondary | ICD-10-CM | POA: Diagnosis not present

## 2023-08-05 DIAGNOSIS — I70201 Unspecified atherosclerosis of native arteries of extremities, right leg: Secondary | ICD-10-CM | POA: Diagnosis not present

## 2023-08-05 DIAGNOSIS — E785 Hyperlipidemia, unspecified: Secondary | ICD-10-CM | POA: Diagnosis not present

## 2023-08-05 DIAGNOSIS — M1611 Unilateral primary osteoarthritis, right hip: Secondary | ICD-10-CM | POA: Diagnosis not present
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
## 2023-09-25 NOTE — Progress Notes (Signed)
 No evidence for acute fracture or stress fracture. Degenerative right acetabular labral tear. Moderate right hip osteoarthrosis.   Mild bilateral gluteus minimus and medius and hamstring origin tendinosis.  Recommend seeing Ortho.  Where would she like to go

## 2023-09-29 NOTE — Progress Notes (Signed)
 Patient advised of results per  Damien Pablo PIETY and verbalized understanding States she was Seen at orthopedic 6/20

## 2023-10-18 NOTE — Telephone Encounter (Signed)
-------------------------------------------------------------------------------   Summary: needs MRI? -------------------------------------------------------------------------------  Daughter called to inform Dr. Leatrice that mother's R hip and leg are now having a lot of pain. She wanted to know if she should procede with an MRI.

## 2023-10-18 NOTE — Telephone Encounter (Signed)
 Copied from CRM [id number]. Topic: Other - Other >> Oct 18, 2023 10:09 AM Arland HERO wrote: Reason of the Call: needs note to excuse from Jury duty  Requesting: Excuse note : [facility] Duty  [phone] [place]  preferred contact: Routine callback turnaround time: 24-48 business hours. Programmer, systems Notified)

## 2023-10-19 ENCOUNTER — Emergency Department
Admission: EM | Admit: 2023-10-19 | Discharge: 2023-10-19 | Disposition: A | Discharge status: Home / Self Care | Attending: Emergency Medicine | Admitting: Emergency Medicine

## 2023-10-19 ENCOUNTER — Encounter: Payer: Self-pay | Admitting: Emergency Medicine

## 2023-10-19 ENCOUNTER — Other Ambulatory Visit: Payer: Self-pay

## 2023-10-19 DIAGNOSIS — M79604 Pain in right leg: Secondary | ICD-10-CM | POA: Diagnosis not present

## 2023-10-19 DIAGNOSIS — Z853 Personal history of malignant neoplasm of breast: Secondary | ICD-10-CM | POA: Insufficient documentation

## 2023-10-19 DIAGNOSIS — M25551 Pain in right hip: Secondary | ICD-10-CM | POA: Diagnosis present

## 2023-10-19 DIAGNOSIS — I10 Essential (primary) hypertension: Secondary | ICD-10-CM | POA: Diagnosis not present

## 2023-10-19 DIAGNOSIS — E119 Type 2 diabetes mellitus without complications: Secondary | ICD-10-CM | POA: Insufficient documentation

## 2023-10-19 MED ORDER — KETOROLAC TROMETHAMINE 15 MG/ML IJ SOLN
15.0000 mg | Freq: Once | INTRAMUSCULAR | Status: AC
  Administered 2023-10-19: 15 mg via INTRAVENOUS
  Filled 2023-10-19: qty 1

## 2023-10-19 NOTE — ED Notes (Addendum)
 PA Summer Sheron said to give toradol  through an IM route instead of ordered IV route. Toradol  given in R deltoid.

## 2023-10-19 NOTE — ED Provider Notes (Signed)
 [facility] Provider Note    Event Date/Time   First MD Initiated Contact with Patient 10/19/23 1915     (approximate)   History   Hip Pain   HPI  Amber Walls is a 83 y.o. female  with a past medical history of type 2 diabetes, vitamin B12 deficiency, hyperlipidemia, hypertension, history of left breast cancer presents to the emergency department with right hip pain and right knee pain radiating down to her pinky toes x 1 month.  Reviewed patient's chart, she was seen approximately 1 month ago for right hip pain and MRI was performed, showed degenerative right acetabular labral tear with moderate right hip osteoarthrosis.  Patient received a cortisone injection at this time and still reporting pain.  Patient also has pain from her right knee extending down into her lateral aspect of her foot, no numbness or tingling, no shortness of breath or chest pain, fall or injury.  No red flag symptoms of back pain.  When I ask her if the pain is sharp, she states she cannot describe the pain.  She has been using extra strength Tylenol at home with some relief.      Physical Exam   Triage Vital Signs: ED Triage Vitals  Encounter Vitals Group     BP 10/19/23 1715 (!) 177/71     Girls Systolic BP Percentile --      Girls Diastolic BP Percentile --      Boys Systolic BP Percentile --      Boys Diastolic BP Percentile --      Pulse Rate 10/19/23 1715 77     Resp 10/19/23 1715 19     Temp 10/19/23 1715 98.8 F (37.1 C)     Temp Source 10/19/23 1715 Oral     SpO2 10/19/23 1715 99 %     Weight 10/19/23 1716 148 lb (67.1 kg)     Height 10/19/23 1716 5' 2 (1.575 m)     Head Circumference --      Peak Flow --      Pain Score 10/19/23 1716 10     Pain Loc --      Pain Education --      Exclude from Growth Chart --     Most recent vital signs: Vitals:   10/19/23 1715 10/19/23 2023  BP: (!) 177/71 (!) 158/53  Pulse: 77 65  Resp: 19 18  Temp: 98.8 F (37.1  C)   SpO2: 99% 98%    General: Awake, in no acute distress. Appears stated age. Head: Normocephalic, atraumatic. Neck: Supple. CV: Regular rate, 65 bpm. Peripheral pulses 2+ and symmetric. No edema. Respiratory: No respiratory distress. Normal respiratory effort. GI: Soft, non-distended. MSK: Normal ROM and 5/5 strength in bilateral lower extremities.  Tender to palpation along the lateral aspect of her right leg from her knee to her toes.  No midline spinal tenderness or paraspinal tenderness.  Positive straight leg raise test on the right side. Skin:Warm, dry, intact. No rashes, lesions, or ecchymosis. No cyanosis or pallor. Neurological: A&Ox4 to person, place, time, and situation.  Sensation intact from L4-S1.   ED Results / Procedures / Treatments   Labs (all labs ordered are listed, but only abnormal results are displayed) Labs Reviewed - No data to display   EKG     RADIOLOGY    PROCEDURES:  Critical Care performed: No   Procedures   MEDICATIONS ORDERED IN ED: Medications  ketorolac  (TORADOL ) 15 MG/ML injection  15 mg (15 mg Intravenous Given 10/19/23 2030)     IMPRESSION / MDM / ASSESSMENT AND PLAN / ED COURSE  I reviewed the triage vital signs and the nursing notes.                              Differential diagnosis includes, but is not limited to, labral tear, musculoskeletal strain, sciatica, cauda equina syndrome  Patient's presentation is most consistent with exacerbation of chronic illness.  Patient is a 83 year old female who presented with right hip and leg pain has been present for 1 month.  Patient had recent MRI showing labral tear on the right side.  Has tried conservative measures and cortisone injection 1 month ago.  Has positive straight leg raise test on the right side.  No red flag symptoms of back pain, less concern for cauda equina syndrome.  Patient is a diabetic.  Decided to provide the patient with Toradol  action as opposed to oral  steroids due to blood sugar concerns.  Discussed holding her aspirin for 1 to 2 days at home.  Provided her with follow-up information with orthopedics as she is looking for a new provider to talk to about her labral tear.   Patient was given the opportunity to ask questions; all questions were answered. Emergency department return precautions were discussed with the patient.  Patient is in agreement to the treatment plan.  Patient is stable for discharge.    FINAL CLINICAL IMPRESSION(S) / ED DIAGNOSES   Final diagnoses:  Right hip pain  Right leg pain     Rx / DC Orders   ED Discharge Orders     None        Note:  This document was prepared using Dragon voice recognition software and may include unintentional dictation errors.     Sheron Salm, PA-C 10/19/23 2117    Jacolyn Pae, MD 10/19/23 2213

## 2023-10-19 NOTE — Discharge Instructions (Addendum)
 You were seen in the emergency department for right leg pain.  I believe this may be coming from your back.  Please use Tylenol and topical Voltaren gel at home to help with your pain.  Follow-up with your primary care provider regarding this pain.  Your MRI from 1 month ago shows a degenerative labral tear which is part of your hip.  I would like you to follow-up with the orthopedic listed here regarding treatment options since you have already tried conservative measures and an injection without any improvement.

## 2023-10-19 NOTE — ED Triage Notes (Signed)
 Patient to ED via POV for right hip pain that radiates down leg. States she had x-rays on 6/20 that showed arthritis. Also had MRI and cortisone shot but still having pain.

## 2023-12-02 ENCOUNTER — Other Ambulatory Visit: Payer: Self-pay | Admitting: Physical Medicine & Rehabilitation

## 2023-12-02 DIAGNOSIS — G8929 Other chronic pain: Secondary | ICD-10-CM

## 2023-12-03 ENCOUNTER — Ambulatory Visit
Admission: RE | Admit: 2023-12-03 | Discharge: 2023-12-03 | Disposition: A | Discharge status: Home / Self Care | Source: Ambulatory Visit | Attending: Physical Medicine & Rehabilitation | Admitting: Physical Medicine & Rehabilitation

## 2023-12-03 DIAGNOSIS — G8929 Other chronic pain: Secondary | ICD-10-CM

## 2024-02-16 NOTE — H&P (View-Only) (Signed)
 Referring Physician:  Dodson Delon FERNS, MD [address] [place],  [place] [address]  Primary Physician:  Amber Perkins, FNP  History of Present Illness: 02/17/2024 [name] is here today with a chief complaint of severe right leg pain.  This been ongoing for 4 months.  She has weakness in her right leg.  She tried physical therapy but was unable to tolerate it.    Bowel/Bladder Dysfunction: none  Conservative measures:  Physical therapy:  has participated in at [facility], but wasn't able to tolerate it per Dr. Dodson note. Multimodal medical therapy including regular antiinflammatories:  meloxicam, tramadol, gabapentin  Injections:  01/28/2024: Right S1 TF ESI (no relief) 12/30/2023: right L5-S1 TFESI (no relief)  Past Surgery: no spinal surgeries  Amber Walls has no symptoms of cervical myelopathy.  The symptoms are causing a significant impact on the patient's life.   I have utilized the care everywhere function in epic to review the outside records available from external health systems.   Review of Systems:  A 10 point review of systems is negative, except for the pertinent positives and negatives detailed in the HPI.  Past Medical History: Past Medical History:  Diagnosis Date   Diabetes mellitus type II, controlled (HCC)    History of left breast cancer    HLD (hyperlipidemia)    Hypertension    Iron deficiency anemia    Pulmonary nodules    Vitamin B12 deficiency     Past Surgical History: Past Surgical History:  Procedure Laterality Date   HIP SURGERY Left    MASTECTOMY PARTIAL / LUMPECTOMY Left     Allergies: Allergies as of 02/17/2024   (No Known Allergies)    Medications:  Current Outpatient Medications:    Accu-Chek Softclix Lancets lancets, TEST BLOOD SUGAR EVERY DAY, Disp: , Rfl:    amLODipine (NORVASC) 5 MG tablet, Take 5 mg by mouth daily. Reported on 07/31/2015, Disp: , Rfl:    aspirin EC 81 MG tablet, Take 81 mg by  mouth daily., Disp: , Rfl:    Calcium Carb-Cholecalciferol (OYSTER SHELL CALCIUM W/D) 500-5 MG-MCG TABS, Take 1 tablet by mouth every morning., Disp: , Rfl:    cyanocobalamin  (,VITAMIN B-12,) 1000 MCG/ML injection, Inject 1,000 mcg into the muscle every 30 (thirty) days., Disp: , Rfl:    ferrous sulfate 325 (65 FE) MG tablet, Take 325 mg by mouth daily with breakfast., Disp: , Rfl:    gabapentin (NEURONTIN) 400 MG capsule, Take 400 mg by mouth 2 (two) times daily., Disp: , Rfl:    lisinopril (PRINIVIL,ZESTRIL) 20 MG tablet, Take 20 mg by mouth daily., Disp: , Rfl:    meloxicam (MOBIC) 15 MG tablet, Take 15 mg by mouth., Disp: , Rfl:    metFORMIN (GLUCOPHAGE) 850 MG tablet, Take 1 tablet by mouth 2 (two) times daily after a meal., Disp: , Rfl:    Multiple Vitamins-Minerals (VISION FORMULA PO), Take by mouth., Disp: , Rfl:    Omega-3 1000 MG CAPS, Take 2 g by mouth., Disp: , Rfl:    omeprazole (PRILOSEC) 40 MG capsule, Take 40 mg by mouth daily., Disp: , Rfl:    pioglitazone (ACTOS) 15 MG tablet, Take 1 tablet by mouth daily., Disp: , Rfl:    rosuvastatin (CRESTOR) 10 MG tablet, Take 10 mg by mouth at bedtime., Disp: , Rfl:    sitaGLIPtin (JANUVIA) 100 MG tablet, Take 100 mg by mouth daily., Disp: , Rfl:    traMADol (ULTRAM) 50 MG tablet,  TAKE 1 BY MOUTH TWICE A DAY AS NEEDED, Disp: , Rfl:   Social History: Social History   Tobacco Use   Smoking status: Never   Smokeless tobacco: Never  Substance Use Topics   Alcohol use: Never    Alcohol/week: 0.0 standard drinks of alcohol    Family Medical History: Family History  Problem Relation Age of Onset   Diabetes Mellitus II Mother    Cancer - Lung Father    Diabetes Mellitus II Sister    Liver disease Sister    Cancer - Lung Brother     Physical Examination: Vitals:   02/17/24 1457  BP: 134/62    General: Patient is in no apparent distress. Attention to examination is appropriate.  Neck:   Supple.  Full range of  motion.  Respiratory: Patient is breathing without any difficulty.   NEUROLOGICAL:     Awake, alert, oriented to person, place, and time.  Speech is clear and fluent.   Cranial Nerves: Pupils equal round and reactive to light.  Facial tone is symmetric.  Facial sensation is symmetric. Shoulder shrug is symmetric. Tongue protrusion is midline.  There is no pronator drift.  Strength: Side Biceps Triceps Deltoid Interossei Grip Wrist Ext. Wrist Flex.  R 5 5 5 5 5 5 5   L 5 5 5 5 5 5 5    Side Iliopsoas Quads Hamstring PF DF EHL  R 5 5 5  4+ 4- 4-  L 5 5 5 5 5 5    Reflexes are 1+ and symmetric at the biceps, triceps, brachioradialis, patella and achilles.   Hoffman's is absent.   Bilateral upper and lower extremity sensation is intact to light touch.    No evidence of dysmetria noted.  Gait is antalgic.    +SLR on R at 30 degrees   Medical Decision Making  Imaging: MR L spine 12/03/2023 IMPRESSION: 1. At L4-5, diffuse disc bulge eccentric to the right along with a 1.2 cm synovial cyst, contributing to mild spinal canal stenosis, severe right lateral recess narrowing, and impingement upon the traversing right L5 nerve root. Moderate facet arthrosis with bilateral facet effusions. 2. At L1-2, mild disc height loss, diffuse disc bulge, right subarticular annular fissure, and right subarticular disc protrusion with mild right lateral recess narrowing. 3. Annular fissure noted at L2-3.   Electronically signed by: Amber Mania MD 12/03/2023 06:58 PM EDT RP Workstation: [id number]  I have personally reviewed the images and agree with the above interpretation.  Assessment and Plan: [name] is a pleasant 83 y.o. 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  She has objective weakness.  She has been suffering from this for 4 months and has tried physical therapy but was unable to tolerate it due to pain.  She has objective weakness on  examination.  I do not feel that further conservative management is indicated given her weakness.  I recommended right sided laminoforaminotomy and resection of her synovial cyst.  We will check her A1c to see if her sugars are appropriate.  I discussed the planned procedure at length with the patient, including the risks, benefits, alternatives, and indications. The risks discussed include but are not limited to bleeding, infection, need for reoperation, spinal fluid leak, stroke, vision loss, anesthetic complication, coma, paralysis, and even death. I also described in detail that improvement was not guaranteed.  The patient expressed understanding of these risks. I described the surgery in layman's terms, and gave ample  opportunity for questions, which were answered to the best of my ability.     Thank you for involving me in the care of this patient.      Dietrick Barris K. Clois MD, [facility]

## 2024-02-16 NOTE — Progress Notes (Unsigned)
 Referring Physician:  Dodson Delon FERNS, MD [address] [place],  [place] [address]  Primary Physician:  Pablo Perkins, FNP  History of Present Illness: 02/17/2024 [name] is here today with a chief complaint of severe right leg pain.  This been ongoing for 4 months.  She has weakness in her right leg.  She tried physical therapy but was unable to tolerate it.    Bowel/Bladder Dysfunction: none  Conservative measures:  Physical therapy:  has participated in at [facility], but wasn't able to tolerate it per Dr. Dodson note. Multimodal medical therapy including regular antiinflammatories:  meloxicam, tramadol, gabapentin  Injections:  01/28/2024: Right S1 TF ESI (no relief) 12/30/2023: right L5-S1 TFESI (no relief)  Past Surgery: no spinal surgeries  CHELLSEA Walls has no symptoms of cervical myelopathy.  The symptoms are causing a significant impact on the patient's life.   I have utilized the care everywhere function in epic to review the outside records available from external health systems.   Review of Systems:  A 10 point review of systems is negative, except for the pertinent positives and negatives detailed in the HPI.  Past Medical History: Past Medical History:  Diagnosis Date   Diabetes mellitus type II, controlled (HCC)    History of left breast cancer    HLD (hyperlipidemia)    Hypertension    Iron deficiency anemia    Pulmonary nodules    Vitamin B12 deficiency     Past Surgical History: Past Surgical History:  Procedure Laterality Date   HIP SURGERY Left    MASTECTOMY PARTIAL / LUMPECTOMY Left     Allergies: Allergies as of 02/17/2024   (No Known Allergies)    Medications:  Current Outpatient Medications:    Accu-Chek Softclix Lancets lancets, TEST BLOOD SUGAR EVERY DAY, Disp: , Rfl:    amLODipine (NORVASC) 5 MG tablet, Take 5 mg by mouth daily. Reported on 07/31/2015, Disp: , Rfl:    aspirin EC 81 MG tablet, Take 81 mg by  mouth daily., Disp: , Rfl:    Calcium Carb-Cholecalciferol (OYSTER SHELL CALCIUM W/D) 500-5 MG-MCG TABS, Take 1 tablet by mouth every morning., Disp: , Rfl:    cyanocobalamin  (,VITAMIN B-12,) 1000 MCG/ML injection, Inject 1,000 mcg into the muscle every 30 (thirty) days., Disp: , Rfl:    ferrous sulfate 325 (65 FE) MG tablet, Take 325 mg by mouth daily with breakfast., Disp: , Rfl:    gabapentin (NEURONTIN) 400 MG capsule, Take 400 mg by mouth 2 (two) times daily., Disp: , Rfl:    lisinopril (PRINIVIL,ZESTRIL) 20 MG tablet, Take 20 mg by mouth daily., Disp: , Rfl:    meloxicam (MOBIC) 15 MG tablet, Take 15 mg by mouth., Disp: , Rfl:    metFORMIN (GLUCOPHAGE) 850 MG tablet, Take 1 tablet by mouth 2 (two) times daily after a meal., Disp: , Rfl:    Multiple Vitamins-Minerals (VISION FORMULA PO), Take by mouth., Disp: , Rfl:    Omega-3 1000 MG CAPS, Take 2 g by mouth., Disp: , Rfl:    omeprazole (PRILOSEC) 40 MG capsule, Take 40 mg by mouth daily., Disp: , Rfl:    pioglitazone (ACTOS) 15 MG tablet, Take 1 tablet by mouth daily., Disp: , Rfl:    rosuvastatin (CRESTOR) 10 MG tablet, Take 10 mg by mouth at bedtime., Disp: , Rfl:    sitaGLIPtin (JANUVIA) 100 MG tablet, Take 100 mg by mouth daily., Disp: , Rfl:    traMADol (ULTRAM) 50 MG tablet,  TAKE 1 BY MOUTH TWICE A DAY AS NEEDED, Disp: , Rfl:   Social History: Social History   Tobacco Use   Smoking status: Never   Smokeless tobacco: Never  Substance Use Topics   Alcohol use: Never    Alcohol/week: 0.0 standard drinks of alcohol    Family Medical History: Family History  Problem Relation Age of Onset   Diabetes Mellitus II Mother    Cancer - Lung Father    Diabetes Mellitus II Sister    Liver disease Sister    Cancer - Lung Brother     Physical Examination: Vitals:   02/17/24 1457  BP: 134/62    General: Patient is in no apparent distress. Attention to examination is appropriate.  Neck:   Supple.  Full range of  motion.  Respiratory: Patient is breathing without any difficulty.   NEUROLOGICAL:     Awake, alert, oriented to person, place, and time.  Speech is clear and fluent.   Cranial Nerves: Pupils equal round and reactive to light.  Facial tone is symmetric.  Facial sensation is symmetric. Shoulder shrug is symmetric. Tongue protrusion is midline.  There is no pronator drift.  Strength: Side Biceps Triceps Deltoid Interossei Grip Wrist Ext. Wrist Flex.  R 5 5 5 5 5 5 5   L 5 5 5 5 5 5 5    Side Iliopsoas Quads Hamstring PF DF EHL  R 5 5 5  4+ 4- 4-  L 5 5 5 5 5 5    Reflexes are 1+ and symmetric at the biceps, triceps, brachioradialis, patella and achilles.   Hoffman's is absent.   Bilateral upper and lower extremity sensation is intact to light touch.    No evidence of dysmetria noted.  Gait is antalgic.    +SLR on R at 30 degrees   Medical Decision Making  Imaging: MR L spine 12/03/2023 IMPRESSION: 1. At L4-5, diffuse disc bulge eccentric to the right along with a 1.2 cm synovial cyst, contributing to mild spinal canal stenosis, severe right lateral recess narrowing, and impingement upon the traversing right L5 nerve root. Moderate facet arthrosis with bilateral facet effusions. 2. At L1-2, mild disc height loss, diffuse disc bulge, right subarticular annular fissure, and right subarticular disc protrusion with mild right lateral recess narrowing. 3. Annular fissure noted at L2-3.   Electronically signed by: Donnice Mania MD 12/03/2023 06:58 PM EDT RP Workstation: [id number]  I have personally reviewed the images and agree with the above interpretation.  Assessment and Plan: [name] is a pleasant 83 y.o. 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  She has objective weakness.  She has been suffering from this for 4 months and has tried physical therapy but was unable to tolerate it due to pain.  She has objective weakness on  examination.  I do not feel that further conservative management is indicated given her weakness.  I recommended right sided laminoforaminotomy and resection of her synovial cyst.  We will check her A1c to see if her sugars are appropriate.  I discussed the planned procedure at length with the patient, including the risks, benefits, alternatives, and indications. The risks discussed include but are not limited to bleeding, infection, need for reoperation, spinal fluid leak, stroke, vision loss, anesthetic complication, coma, paralysis, and even death. I also described in detail that improvement was not guaranteed.  The patient expressed understanding of these risks. I described the surgery in layman's terms, and gave ample  opportunity for questions, which were answered to the best of my ability.     Thank you for involving me in the care of this patient.      Sharnette Kitamura K. Clois MD, [facility]

## 2024-02-17 ENCOUNTER — Ambulatory Visit
Admission: RE | Admit: 2024-02-17 | Discharge: 2024-02-17 | Disposition: A | Discharge status: Home / Self Care | Source: Ambulatory Visit | Attending: Neurosurgery | Admitting: Neurosurgery

## 2024-02-17 ENCOUNTER — Encounter: Payer: Self-pay | Admitting: Neurosurgery

## 2024-02-17 ENCOUNTER — Ambulatory Visit: Admitting: Neurosurgery

## 2024-02-17 VITALS — BP 134/62 | Ht 62.0 in | Wt 150.0 lb

## 2024-02-17 DIAGNOSIS — E1169 Type 2 diabetes mellitus with other specified complication: Secondary | ICD-10-CM

## 2024-02-17 DIAGNOSIS — M7138 Other bursal cyst, other site: Secondary | ICD-10-CM

## 2024-02-17 DIAGNOSIS — M5416 Radiculopathy, lumbar region: Secondary | ICD-10-CM | POA: Diagnosis not present

## 2024-02-17 LAB — HEMOGLOBIN A1C
Hgb A1c MFr Bld: 7 % — ABNORMAL HIGH (ref 4.8–5.6)
Mean Plasma Glucose: 154.2 mg/dL

## 2024-02-18 ENCOUNTER — Telehealth: Payer: Self-pay

## 2024-02-18 ENCOUNTER — Telehealth: Payer: Self-pay | Admitting: Neurosurgery

## 2024-02-18 ENCOUNTER — Other Ambulatory Visit: Payer: Self-pay

## 2024-02-18 DIAGNOSIS — Z01818 Encounter for other preprocedural examination: Secondary | ICD-10-CM

## 2024-02-18 DIAGNOSIS — M5416 Radiculopathy, lumbar region: Secondary | ICD-10-CM

## 2024-02-18 DIAGNOSIS — M7138 Other bursal cyst, other site: Secondary | ICD-10-CM

## 2024-02-18 NOTE — Telephone Encounter (Signed)
 Pt daughter Ellouise Quale wanted to know the follow up procedure for after she got the blood work done? I did not see any follow up being mentioned but they would like to know what else will need to be done? [phone] c/b#

## 2024-02-18 NOTE — Addendum Note (Signed)
 Addended by: Jacorie Ernsberger E on: 02/18/2024 03:25 PM   Modules accepted: Orders

## 2024-02-18 NOTE — Telephone Encounter (Signed)
 See schedule surgery encounter

## 2024-02-18 NOTE — Telephone Encounter (Signed)
 Called patient to discuss scheduling surgery and surgery instructions as listed below. She requested that I discuss this information with her daughter, Ellouise. Called [place] and scheduled surgery, post op dates, and discussed surgery instrucrtions. Physical copy placed in outgoing mail to be sent to her PO box per patient request.    Please see below for information in regards to your upcoming surgery:   Planned surgery: Right L4-5 Laminoforaminotomy   Surgery date: 03/03/24 at [facility] (Medical Mall: [address], [place], [place] [address]) - you will find out your arrival time the business day before your surgery.   Pre-op appointment at [facility] Pre-admit Testing: you will receive a call with a date/time for this appointment. If you are scheduled for an in person appointment, Pre-admit Testing is located on the first floor of the [facility] building, [address], [address]. During this appointment, they will advise you which medications you can take the morning of surgery, and which medications you will need to hold for surgery. Labs (such as blood work, EKG) may be done at your pre-op appointment. You are not required to fast for these labs. Should you need to change your pre-op appointment, please call Pre-admit testing at [phone].      Blood thinners:   Aspirin 81mg :   if taking as a preventative, stop aspirin 7 days prior, resume aspirin 14 days after     Diabetes/heart failure/kidney disease/weight loss medications that require an extended hold: Per anesthesia guidelines (due to the increased risk of aspiration caused by delayed gastric emptying):  Metformin: hold for 2 days prior to surgery    Surgical clearance: we will send a clearance form to Damien Coupe, FNP. They may wish to see you in their office prior to signing the clearance form. If so, they may call you to schedule an appointment.     Common restrictions  after spine surgery: No bending, lifting, or twisting ("BLT"). Avoid lifting objects heavier than 10 pounds for the first 6 weeks after surgery. Where possible, avoid household activities that involve lifting, bending, reaching, pushing, or pulling such as laundry, vacuuming, grocery shopping, and childcare. Try to arrange for help from friends and family for these activities while you heal. Do not drive while taking prescription pain medication. Weeks 6 through 12 after surgery: avoid lifting more than 25 pounds.     How to contact us :  If you have any questions/concerns before or after surgery, you can reach us  at [phone], or you can send a mychart message. We can be reached by phone or mychart 8am-4pm, Monday-Friday.  *Please note: Calls after 4pm are forwarded to a third party answering service. Mychart messages are not routinely monitored during evenings, weekends, and holidays. Please call our office to contact the answering service for urgent concerns during non-business hours.      If you have FMLA/disability paperwork, please drop it off or fax it to [phone]   Appointments/FMLA & disability paperwork: Reche Hait, & Nichole Registered Nurse/Surgery scheduler: Kendelyn, RN & Katie, RN Certified Medical Assistants: Don, CMA, Elenor, CMA, Damien, CMA, & Auston, [place] Physician Assistants: Lyle Decamp, PA-C, Edsel Goods, PA-C & Glade Boys, PA-C Surgeons: Penne Sharps, MD & Reeves Daisy, MD     [facility] PREADMIT TESTING VISIT and SURGERY INFORMATION SHEET   Now that surgery has been scheduled you can anticipate several phone calls from [facility] services. A pharmacy technician will call you to verify your current  list of medications taken at home.               The Pre-Service Center will call to verify your insurance information and to give you billing estimates and information.             The Preadmit Testing Office will be  calling to schedule a visit to obtain information for the anesthesia team and provide instructions on preparation for surgery.  What can you expect for the Preadmit Testing Visit: Appointments may be scheduled in-person or by telephone.  If a telephone visit is scheduled, you may be asked to come into the office to have lab tests or other studies performed.   This visit will not be completed any greater than 14 days prior to your surgery.  If your surgery has been scheduled for a future date, please do not be alarmed if we have not contacted you to schedule an appointment more than a month prior to the surgery date.    Please be prepared to provide the following information during this appointment:            -Personal medical history                                               -Medication and allergy list            -Any history of problems with anesthesia              -Recent lab work or diagnostic studies            -Please notify us  of any needs we should be aware of to provide the best care possible           -You will be provided with instructions on how to prepare for your surgery.    On The Day of Surgery:  You must have a driver to take you home after surgery, you will be asked not to drive for 24 hours following surgery.  Taxi, Gisele and non-medical transport will not be acceptable means of transportation unless you have a responsible individual who will be traveling with you.  Visitors in the surgical area:   2 people will be able to visit you in your room once your preparation for surgery has been completed. During surgery, your visitors will be asked to wait in the Surgery Waiting Area.  It is not a requirement for them to stay, if they prefer to leave and come back.  Your visitor(s) will be given an update once the surgery has been completed.  No visitors are allowed in the initial recovery room to respect patient privacy and safety.  Once you are more awake and transfer to the  secondary recovery area, or are transferred to an inpatient room, visitors will again be able to see you.  To respect and protect your privacy: We will ask on the day of surgery who your driver will be and what the contact number for that individual will be. We will ask if it is okay to share information with this individual, or if there is an alternative individual that we, or the surgeon, should contact to provide updates and information. If family or friends come to the surgical information desk requesting information about you, who you have not listed with us , no information will be given.  It may be helpful to designate someone as the main contact who will be responsible for updating your other friends and family.    PREADMIT TESTING OFFICE: [phone] SAME DAY SURGERY: [phone] We look forward to caring for you before and throughout the process of your surgery.

## 2024-02-21 ENCOUNTER — Other Ambulatory Visit: Payer: Self-pay | Admitting: Family Medicine

## 2024-02-21 ENCOUNTER — Inpatient Hospital Stay
Admission: RE | Admit: 2024-02-21 | Discharge: 2024-02-21 | Disposition: A | Discharge status: Home / Self Care | Payer: Self-pay | Source: Ambulatory Visit | Attending: Neurosurgery | Admitting: Neurosurgery

## 2024-02-21 DIAGNOSIS — Z049 Encounter for examination and observation for unspecified reason: Secondary | ICD-10-CM

## 2024-02-29 ENCOUNTER — Inpatient Hospital Stay
Admission: RE | Admit: 2024-02-29 | Discharge: 2024-02-29 | Disposition: A | Source: Ambulatory Visit | Attending: Neurosurgery | Admitting: Neurosurgery

## 2024-02-29 ENCOUNTER — Other Ambulatory Visit: Payer: Self-pay

## 2024-02-29 VITALS — BP 152/55 | HR 73 | Resp 14 | Ht 62.0 in | Wt 151.0 lb

## 2024-02-29 DIAGNOSIS — D519 Vitamin B12 deficiency anemia, unspecified: Secondary | ICD-10-CM | POA: Insufficient documentation

## 2024-02-29 DIAGNOSIS — Z01818 Encounter for other preprocedural examination: Secondary | ICD-10-CM | POA: Diagnosis not present

## 2024-02-29 DIAGNOSIS — E78019 Familial hypercholesterolemia, unspecified: Secondary | ICD-10-CM | POA: Insufficient documentation

## 2024-02-29 DIAGNOSIS — E119 Type 2 diabetes mellitus without complications: Secondary | ICD-10-CM

## 2024-02-29 DIAGNOSIS — I10 Essential (primary) hypertension: Secondary | ICD-10-CM

## 2024-02-29 HISTORY — DX: Gastro-esophageal reflux disease without esophagitis: K21.9

## 2024-02-29 HISTORY — DX: Malignant (primary) neoplasm, unspecified: C80.1

## 2024-02-29 HISTORY — DX: Unspecified osteoarthritis, unspecified site: M19.90

## 2024-02-29 LAB — BASIC METABOLIC PANEL WITH GFR
Anion gap: 14 (ref 5–15)
BUN: 20 mg/dL (ref 8–23)
CO2: 24 mmol/L (ref 22–32)
Calcium: 9.8 mg/dL (ref 8.9–10.3)
Chloride: 100 mmol/L (ref 98–111)
Creatinine, Ser: 0.97 mg/dL (ref 0.44–1.00)
GFR, Estimated: 58 mL/min — ABNORMAL LOW (ref >60)
Glucose, Bld: 129 mg/dL — ABNORMAL HIGH (ref 70–99)
Potassium: 4.3 mmol/L (ref 3.5–5.1)
Sodium: 138 mmol/L (ref 135–145)

## 2024-02-29 LAB — CBC
HCT: 39.4 % (ref 36.0–46.0)
Hemoglobin: 12.4 g/dL (ref 12.0–15.0)
MCH: 29.7 pg (ref 26.0–34.0)
MCHC: 31.5 g/dL (ref 30.0–36.0)
MCV: 94.5 fL (ref 80.0–100.0)
Platelets: 301 K/uL (ref 150–400)
RBC: 4.17 MIL/uL (ref 3.87–5.11)
RDW: 16 % — ABNORMAL HIGH (ref 11.5–15.5)
WBC: 6.1 K/uL (ref 4.0–10.5)
nRBC: 0 % (ref 0.0–0.2)

## 2024-02-29 LAB — TYPE AND SCREEN
ABO/RH(D): B POS
Antibody Screen: NEGATIVE

## 2024-02-29 LAB — SURGICAL PCR SCREEN
MRSA, PCR: NEGATIVE
Staphylococcus aureus: NEGATIVE

## 2024-02-29 NOTE — Patient Instructions (Addendum)
 Your procedure is scheduled on: Friday 03/03/24 Report to the Registration Desk on the 1st floor of the Medical Mall. To find out your arrival time, please call [phone] between 1PM - 3PM on: Thursday 03/02/24 If your arrival time is 6:00 am, do not arrive before that time as the Medical Mall entrance doors do not open until 6:00 am.  REMEMBER: Instructions that are not followed completely may result in serious medical risk, up to and including death; or upon the discretion of your surgeon and anesthesiologist your surgery may need to be rescheduled.  Do not eat food after midnight the night before surgery.  No gum chewing or hard candies.  You may however, drink CLEAR liquids up to 2 hours before you are scheduled to arrive for your surgery. Do not drink anything within 2 hours of your scheduled arrival time.  Clear liquids include: - water   **Type 1 and Type 2 diabetics should only drink water.**  One week prior to surgery: Stop Anti-inflammatories (NSAIDS) such as Advil, Aleve, Ibuprofen, Motrin, Naproxen, Naprosyn and Aspirin based products such as Excedrin, Goody's Powder, BC Powder.  You may however, continue to take Tylenol if needed for pain up until the day of surgery.  Stop ANY OVER THE COUNTER supplements and vitamins until after surgery. Continue your iron.  **Follow guidelines for insulin and diabetes medications.** Januvia hold for 2 days with last dose taken TODAY 02/29/24  **Follow recommendations regarding stopping blood thinners.** Aspirin holding for 7 days before and 14 days after surgery.  Continue taking all of your other prescription medications up until the day of surgery.  ON THE DAY OF SURGERY ONLY TAKE THESE MEDICATIONS WITH SIPS OF WATER:  gabapentin (NEURONTIN) 600 MG tablet  omeprazole (PRILOSEC) 40 MG capsule   No Alcohol for 24 hours before or after surgery.  No Smoking including e-cigarettes for 24 hours before surgery.  No chewable tobacco  products for at least 6 hours before surgery.  No nicotine patches on the day of surgery.  Do not use any recreational drugs for at least a week (preferably 2 weeks) before your surgery.  Please be advised that the combination of cocaine and anesthesia may have negative outcomes, up to and including death. If you test positive for cocaine, your surgery will be cancelled.  On the morning of surgery brush your teeth with toothpaste and water, you may rinse your mouth with mouthwash if you wish. Do not swallow any toothpaste or mouthwash.  Use CHG Soap or wipes as directed on instruction sheet. Use daily starting today.  Do not shave body hair from the neck down.   Do not wear lotions, powders, or perfumes on the day of surgery.  Wear comfortable clothing (specific to your surgery type) to the hospital.  Do not wear jewelry, make-up, hairpins, clips or nail polish.  For welded (permanent) jewelry: bracelets, anklets, waist bands, etc.  Please have this removed prior to surgery.  If it is not removed, there is a chance that hospital personnel will need to cut it off on the day of surgery.  Contact lenses, hearing aids and dentures may not be worn into surgery.  Do not bring valuables to the hospital. [facility] is not responsible for any missing/lost belongings or valuables.   Notify your doctor if there is any change in your medical condition (cold, fever, infection).  After surgery, you can help prevent lung complications by doing breathing exercises.  Take deep breaths and cough every 1-2  hours. Your doctor may order a device called an Incentive Spirometer to help you take deep breaths.  If you are being discharged the day of surgery, you will not be allowed to drive home. You will need a responsible individual to drive you home and stay with you for 24 hours after surgery.   Please call the Pre-admissions Testing Dept. at [phone] if you have any questions about these  instructions.  Surgery Visitation Policy:  Patients having surgery or a procedure may have two visitors.  Children under the age of 29 must have an adult with them who is not the patient.   Merchandiser, Retail to address health-related social needs:  [email or web address]    Pre-operative 4 CHG Bath Instructions   You can play a key role in reducing the risk of infection after surgery. Your skin needs to be as free of germs as possible. You can reduce the number of germs on your skin by washing with CHG (chlorhexidine gluconate) soap before surgery. CHG is an antiseptic soap that kills germs and continues to kill germs even after washing.   DO NOT use if you have an allergy to chlorhexidine/CHG or antibacterial soaps. If your skin becomes reddened or irritated, stop using the CHG and notify one of our RNs at [phone].   Please shower with the CHG soap starting 4 days before surgery using the following schedule:       Please keep in mind the following:  DO NOT shave, including legs and underarms, starting the day of your first shower.   You may shave your face at any point before/day of surgery.  Place clean sheets on your bed the day you start using CHG soap. Use a clean washcloth (not used since being washed) for each shower. DO NOT sleep with pets once you start using the CHG.   CHG Shower Instructions:  If you choose to wash your hair and private area, wash first with your normal shampoo/soap.  After you use shampoo/soap, rinse your hair and body thoroughly to remove shampoo/soap residue.  Turn the water OFF and apply about 3 tablespoons (45 ml) of CHG soap to a CLEAN washcloth.  Apply CHG soap ONLY FROM YOUR NECK DOWN TO YOUR TOES (washing for 3-5 minutes)  DO NOT use CHG soap on face, private areas, open wounds, or sores.  Pay special attention to the area where your surgery is being performed.  If you are having back surgery, having someone wash  your back for you may be helpful. Wait 2 minutes after CHG soap is applied, then you may rinse off the CHG soap.  Pat dry with a clean towel  Put on clean clothes/pajamas   If you choose to wear lotion, please use ONLY the CHG-compatible lotions on the back of this paper.     Additional instructions for the day of surgery: DO NOT APPLY any lotions, deodorants, cologne, or perfumes.   Put on clean/comfortable clothes.  Brush your teeth.  Ask your nurse before applying any prescription medications to the skin.      CHG Compatible Lotions   Aveeno Moisturizing lotion  Cetaphil Moisturizing Cream  Cetaphil Moisturizing Lotion  Clairol Herbal Essence Moisturizing Lotion, Dry Skin  Clairol Herbal Essence Moisturizing Lotion, Extra Dry Skin  Clairol Herbal Essence Moisturizing Lotion, Normal Skin  Curel Age Defying Therapeutic Moisturizing Lotion with Alpha Hydroxy  Curel Extreme Care Body Lotion  Curel Soothing Hands Moisturizing Hand Lotion  Curel Therapeutic Moisturizing Cream, Fragrance-Free  Curel Therapeutic Moisturizing Lotion, Fragrance-Free  Curel Therapeutic Moisturizing Lotion, Original Formula  Eucerin Daily Replenishing Lotion  Eucerin Dry Skin Therapy Plus Alpha Hydroxy Crme  Eucerin Dry Skin Therapy Plus Alpha Hydroxy Lotion  Eucerin Original Crme  Eucerin Original Lotion  Eucerin Plus Crme Eucerin Plus Lotion  Eucerin TriLipid Replenishing Lotion  Keri Anti-Bacterial Hand Lotion  Keri Deep Conditioning Original Lotion Dry Skin Formula Softly Scented  Keri Deep Conditioning Original Lotion, Fragrance Free Sensitive Skin Formula  Keri Lotion Fast Absorbing Fragrance Free Sensitive Skin Formula  Keri Lotion Fast Absorbing Softly Scented Dry Skin Formula  Keri Original Lotion  Keri Skin Renewal Lotion Keri Silky Smooth Lotion  Keri Silky Smooth Sensitive Skin Lotion  Nivea Body Creamy Conditioning Oil  Nivea Body Extra Enriched Tefl Teacher Moisturizing Lotion Nivea Crme  Nivea Skin Firming Lotion  NutraDerm 30 Skin Lotion  NutraDerm Skin Lotion  NutraDerm Therapeutic Skin Cream  NutraDerm Therapeutic Skin Lotion  ProShield Protective Hand Cream  Provon moisturizing lotion

## 2024-03-03 ENCOUNTER — Encounter: Admitting: Urgent Care

## 2024-03-03 ENCOUNTER — Ambulatory Visit
Admission: RE | Admit: 2024-03-03 | Discharge: 2024-03-03 | Disposition: A | Attending: Neurosurgery | Admitting: Neurosurgery

## 2024-03-03 ENCOUNTER — Encounter: Admission: RE | Disposition: A | Payer: Self-pay | Source: Home / Self Care | Attending: Neurosurgery

## 2024-03-03 ENCOUNTER — Ambulatory Visit

## 2024-03-03 ENCOUNTER — Other Ambulatory Visit: Payer: Self-pay

## 2024-03-03 ENCOUNTER — Ambulatory Visit: Admitting: Anesthesiology

## 2024-03-03 ENCOUNTER — Encounter: Payer: Self-pay | Admitting: Neurosurgery

## 2024-03-03 DIAGNOSIS — M7138 Other bursal cyst, other site: Secondary | ICD-10-CM | POA: Diagnosis not present

## 2024-03-03 DIAGNOSIS — M5416 Radiculopathy, lumbar region: Secondary | ICD-10-CM

## 2024-03-03 DIAGNOSIS — Z01818 Encounter for other preprocedural examination: Secondary | ICD-10-CM

## 2024-03-03 DIAGNOSIS — E119 Type 2 diabetes mellitus without complications: Secondary | ICD-10-CM

## 2024-03-03 HISTORY — PX: FORAMINOTOMY 1 LEVEL: SHX5835

## 2024-03-03 LAB — GLUCOSE, CAPILLARY
Glucose-Capillary: 154 mg/dL — ABNORMAL HIGH (ref 70–99)
Glucose-Capillary: 152 mg/dL — ABNORMAL HIGH (ref 70–99)

## 2024-03-03 LAB — ABO/RH: ABO/RH(D): B POS

## 2024-03-03 SURGERY — FORAMINOTOMY 1 LEVEL
Anesthesia: General | Laterality: Right

## 2024-03-03 MED ORDER — OXYCODONE HCL 5 MG PO TABS: 5.0000 mg | ORAL_TABLET | Freq: Once | ORAL | Status: DC | PRN

## 2024-03-03 MED ORDER — DEXAMETHASONE SOD PHOSPHATE PF 10 MG/ML IJ SOLN
INTRAMUSCULAR | Status: DC | PRN
  Administered 2024-03-03: 10 mg via INTRAVENOUS

## 2024-03-03 MED ORDER — FENTANYL CITRATE (PF) 100 MCG/2ML IJ SOLN: 25.0000 ug | INTRAMUSCULAR | Status: DC | PRN

## 2024-03-03 MED ORDER — BUPIVACAINE-EPINEPHRINE (PF) 0.5% -1:200000 IJ SOLN
INTRAMUSCULAR | Status: AC
  Filled 2024-03-03: qty 10

## 2024-03-03 MED ORDER — CHLORHEXIDINE GLUCONATE 0.12 % MT SOLN
15.0000 mL | Freq: Once | OROMUCOSAL | Status: AC
  Administered 2024-03-03: 15 mL via OROMUCOSAL

## 2024-03-03 MED ORDER — OXYCODONE HCL 5 MG PO TABS
5.0000 mg | ORAL_TABLET | ORAL | 0 refills | Status: AC | PRN
  Filled 2024-03-03: qty 30, 5d supply, fill #0

## 2024-03-03 MED ORDER — BUPIVACAINE-EPINEPHRINE (PF) 0.5% -1:200000 IJ SOLN
INTRAMUSCULAR | Status: DC | PRN
  Administered 2024-03-03: 5 mL

## 2024-03-03 MED ORDER — LIDOCAINE HCL (CARDIAC) PF 100 MG/5ML IV SOSY
PREFILLED_SYRINGE | INTRAVENOUS | Status: DC | PRN
  Administered 2024-03-03: 80 mg via INTRAVENOUS

## 2024-03-03 MED ORDER — SODIUM CHLORIDE 0.9 % IV SOLN: INTRAVENOUS | Status: DC

## 2024-03-03 MED ORDER — HYDRALAZINE HCL 20 MG/ML IJ SOLN
INTRAMUSCULAR | Status: AC
  Filled 2024-03-03: qty 1

## 2024-03-03 MED ORDER — ACETAMINOPHEN 10 MG/ML IV SOLN
INTRAVENOUS | Status: AC
  Filled 2024-03-03: qty 100

## 2024-03-03 MED ORDER — HYDRALAZINE HCL 20 MG/ML IJ SOLN
INTRAMUSCULAR | Status: DC | PRN
  Administered 2024-03-03: 10 mg via INTRAVENOUS

## 2024-03-03 MED ORDER — FENTANYL CITRATE (PF) 100 MCG/2ML IJ SOLN
INTRAMUSCULAR | Status: DC | PRN
  Administered 2024-03-03: 100 ug via INTRAVENOUS

## 2024-03-03 MED ORDER — SODIUM CHLORIDE (PF) 0.9 % IJ SOLN
INTRAMUSCULAR | Status: AC
  Filled 2024-03-03: qty 20

## 2024-03-03 MED ORDER — 0.9 % SODIUM CHLORIDE (POUR BTL) OPTIME
TOPICAL | Status: DC | PRN
  Administered 2024-03-03: 500 mL

## 2024-03-03 MED ORDER — SUGAMMADEX SODIUM 200 MG/2ML IV SOLN
INTRAVENOUS | Status: DC | PRN
  Administered 2024-03-03: 180 mg via INTRAVENOUS

## 2024-03-03 MED ORDER — ORAL CARE MOUTH RINSE: 15.0000 mL | Freq: Once | OROMUCOSAL | Status: AC

## 2024-03-03 MED ORDER — BUPIVACAINE HCL (PF) 0.5 % IJ SOLN
INTRAMUSCULAR | Status: AC
  Filled 2024-03-03: qty 30

## 2024-03-03 MED ORDER — CEFAZOLIN IN SODIUM CHLORIDE 2-0.9 GM/100ML-% IV SOLN
2.0000 g | Freq: Once | INTRAVENOUS | Status: AC
  Administered 2024-03-03: 2 g via INTRAVENOUS

## 2024-03-03 MED ORDER — DROPERIDOL 2.5 MG/ML IJ SOLN
0.6250 mg | Freq: Once | INTRAMUSCULAR | Status: AC | PRN
  Administered 2024-03-03: 0.625 mg via INTRAVENOUS

## 2024-03-03 MED ORDER — METHYLPREDNISOLONE ACETATE 40 MG/ML IJ SUSP
INTRAMUSCULAR | Status: DC | PRN
  Administered 2024-03-03: 40 mg

## 2024-03-03 MED ORDER — LACTATED RINGERS IV SOLN: INTRAVENOUS | Status: DC | PRN

## 2024-03-03 MED ORDER — ACETAMINOPHEN 10 MG/ML IV SOLN
INTRAVENOUS | Status: DC | PRN
  Administered 2024-03-03: 1000 mg via INTRAVENOUS

## 2024-03-03 MED ORDER — HYDROMORPHONE HCL 1 MG/ML IJ SOLN
INTRAMUSCULAR | Status: AC
  Filled 2024-03-03: qty 1

## 2024-03-03 MED ORDER — FENTANYL CITRATE (PF) 100 MCG/2ML IJ SOLN
INTRAMUSCULAR | Status: AC
  Filled 2024-03-03: qty 2

## 2024-03-03 MED ORDER — PROPOFOL 10 MG/ML IV BOLUS
INTRAVENOUS | Status: DC | PRN
  Administered 2024-03-03: 80 mg via INTRAVENOUS
  Administered 2024-03-03: 50 mg via INTRAVENOUS
  Administered 2024-03-03: 100 mg via INTRAVENOUS
  Administered 2024-03-03: 125 ug/kg/min via INTRAVENOUS
  Administered 2024-03-03: 30 mg via INTRAVENOUS

## 2024-03-03 MED ORDER — SENNA 8.6 MG PO TABS
1.0000 | ORAL_TABLET | Freq: Two times a day (BID) | ORAL | 0 refills | Status: DC | PRN
  Filled 2024-03-03: qty 30, 15d supply, fill #0

## 2024-03-03 MED ORDER — SODIUM CHLORIDE (PF) 0.9 % IJ SOLN
INTRAMUSCULAR | Status: DC | PRN
  Administered 2024-03-03: 60 mL

## 2024-03-03 MED ORDER — BUPIVACAINE LIPOSOME 1.3 % IJ SUSP
INTRAMUSCULAR | Status: AC
  Filled 2024-03-03: qty 20

## 2024-03-03 MED ORDER — SURGIFLO WITH THROMBIN (HEMOSTATIC MATRIX KIT) OPTIME
TOPICAL | Status: DC | PRN
  Administered 2024-03-03: 1 via TOPICAL

## 2024-03-03 MED ORDER — POLYETHYLENE GLYCOL 3350 17 GM/SCOOP PO POWD
17.0000 g | Freq: Every day | ORAL | 0 refills | Status: DC | PRN
  Filled 2024-03-03: qty 238, 14d supply, fill #0

## 2024-03-03 MED ORDER — METHOCARBAMOL 500 MG PO TABS
500.0000 mg | ORAL_TABLET | Freq: Four times a day (QID) | ORAL | 0 refills | Status: DC | PRN
  Filled 2024-03-03: qty 120, 30d supply, fill #0

## 2024-03-03 MED ORDER — HYDROMORPHONE HCL 1 MG/ML IJ SOLN
INTRAMUSCULAR | Status: DC | PRN
  Administered 2024-03-03: .5 mg via INTRAVENOUS

## 2024-03-03 MED ORDER — CEFAZOLIN SODIUM-DEXTROSE 2-4 GM/100ML-% IV SOLN
INTRAVENOUS | Status: AC
  Filled 2024-03-03: qty 100

## 2024-03-03 MED ORDER — CHLORHEXIDINE GLUCONATE 0.12 % MT SOLN
OROMUCOSAL | Status: AC
  Filled 2024-03-03: qty 15

## 2024-03-03 MED ORDER — OXYCODONE HCL 5 MG/5ML PO SOLN: 5.0000 mg | Freq: Once | ORAL | Status: DC | PRN

## 2024-03-03 MED ORDER — DROPERIDOL 2.5 MG/ML IJ SOLN
INTRAMUSCULAR | Status: AC
  Filled 2024-03-03: qty 2

## 2024-03-03 MED ORDER — ONDANSETRON HCL 4 MG/2ML IJ SOLN
INTRAMUSCULAR | Status: DC | PRN
  Administered 2024-03-03: 4 mg via INTRAVENOUS

## 2024-03-03 MED ORDER — ACETAMINOPHEN 10 MG/ML IV SOLN: 1000.0000 mg | Freq: Once | INTRAVENOUS | Status: DC | PRN

## 2024-03-03 MED ORDER — METHYLPREDNISOLONE ACETATE 40 MG/ML IJ SUSP
INTRAMUSCULAR | Status: AC
  Filled 2024-03-03: qty 1

## 2024-03-03 MED ORDER — ROCURONIUM BROMIDE 100 MG/10ML IV SOLN
INTRAVENOUS | Status: DC | PRN
  Administered 2024-03-03: 50 mg via INTRAVENOUS

## 2024-03-03 SURGICAL SUPPLY — 30 items
BASIN KIT SINGLE STR (MISCELLANEOUS) ×1 IMPLANT
BUR NEURO DRILL SOFT 3.0X3.8M (BURR) ×1 IMPLANT
DERMABOND ADVANCED .7 DNX12 (GAUZE/BANDAGES/DRESSINGS) ×1 IMPLANT
DRAPE C ARM PK CFD 31 SPINE (DRAPES) ×1 IMPLANT
DRAPE LAPAROTOMY 100X77 ABD (DRAPES) ×1 IMPLANT
DRAPE SPINE LEICA/WILD 54X150 (DRAPES) ×1 IMPLANT
DRSG OPSITE POSTOP 3X4 (GAUZE/BANDAGES/DRESSINGS) ×1 IMPLANT
ELECTRODE EZSTD 165MM 6.5IN (MISCELLANEOUS) ×1 IMPLANT
ELECTRODE REM PT RTRN 9FT ADLT (ELECTROSURGICAL) ×1 IMPLANT
GLOVE BIOGEL PI IND STRL 6.5 (GLOVE) ×1 IMPLANT
GLOVE SURG SYN 6.5 PF PI (GLOVE) ×1 IMPLANT
GLOVE SURG SYN 8.5 PF PI (GLOVE) ×3 IMPLANT
GOWN SRG LRG LVL 4 IMPRV REINF (GOWNS) ×1 IMPLANT
GOWN SRG XL LVL 3 NONREINFORCE (GOWNS) ×1 IMPLANT
KIT SPINAL PRONEVIEW (KITS) ×1 IMPLANT
MANIFOLD NEPTUNE II (INSTRUMENTS) ×1 IMPLANT
NDL HYPO 18GX1.5 BLUNT FILL (NEEDLE) IMPLANT
NDL SAFETY ECLIP 18X1.5 (MISCELLANEOUS) ×1 IMPLANT
NS IRRIG 500ML POUR BTL (IV SOLUTION) ×1 IMPLANT
PACK LAMINECTOMY ARMC (PACKS) ×1 IMPLANT
PAD ARMBOARD POSITIONER FOAM (MISCELLANEOUS) ×1 IMPLANT
SURGIFLO W/THROMBIN 8M KIT (HEMOSTASIS) ×1 IMPLANT
SUT STRATA 3-0 15 PS-2 (SUTURE) ×1 IMPLANT
SUT VIC AB 0 CT1 27XCR 8 STRN (SUTURE) ×1 IMPLANT
SUT VIC AB 2-0 CT1 18 (SUTURE) ×1 IMPLANT
SYR 20ML LL LF (SYRINGE) IMPLANT
SYR 30ML LL (SYRINGE) ×2 IMPLANT
SYR 3ML LL SCALE MARK (SYRINGE) ×1 IMPLANT
SYR TB 1ML LL NO SAFETY (SYRINGE) IMPLANT
TRAP FLUID SMOKE EVACUATOR (MISCELLANEOUS) ×1 IMPLANT

## 2024-03-03 NOTE — Discharge Instructions (Addendum)

## 2024-03-03 NOTE — Op Note (Signed)
 Indications: 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 The patient tried and failed conservative management, prompting surgical intervention.  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   Preoperative Note:  Risks of surgery discussed include: infection, bleeding, stroke, coma, death, paralysis, CSF leak, nerve/spinal cord injury, numbness, tingling, weakness, complex regional pain syndrome, recurrent stenosis and/or disc herniation, vascular injury, development of instability, neck/back pain, need for further surgery, persistent symptoms, development of deformity, and the risks of anesthesia. The patient understood these risks and agreed to proceed.  Operative Note:   1. Right L4-5 laminoforaminotomy  The patient was then brought from the preoperative center with intravenous access established.  The patient underwent general anesthesia and endotracheal tube intubation, and was then rotated on the [place] rail top where all pressure points were appropriately padded.  The skin was then thoroughly cleansed.  Perioperative antibiotic prophylaxis was administered.  Sterile prep and drapes were then applied and a timeout was then observed.  C-arm was brought into the field under sterile conditions and under lateral visualization the L4-5 interspace was identified and marked.  The incision was marked on the right and injected with local anesthetic. Once this was complete a 2 cm incision was opened with the use of a #10 blade knife.    The metrx tubes were sequentially advanced and confirmed in position at L4-5. An 18mm by 50mm tube was locked in place to the bed side attachment.  The  microscope was then sterilely brought into the field and muscle creep was hemostased with a bipolar and resected with a pituitary rongeur.  A Bovie extender was then used to expose the spinous process and lamina.  Careful attention was placed to not violate the facet capsule. A 3 mm matchstick drill bit was then used to make a hemi-laminotomy trough until the ligamentum flavum was exposed.  This was extended to the base of the spinous process and to the contralateral side to remove all the central bone from each side.  Once this was complete and the underlying ligamentum flavum was visualized, it was dissected with a curette and resected with Kerrison rongeurs.  Extensive ligamentum hypertrophy was noted, requiring a substantial amount of time and care for removal.    A large synovial cyst was encountered and removed.  The dura was identified and palpated. The kerrison rongeur was then used to remove the medial facet bilaterally until no compression was noted.  A balltip probe was used to confirm decompression of the ipsilateral L5 nerve root.  No CSF leak was noted.  Depo-Medrol  was placed on the nerve root.  The wound was copiously irrigated. The tube system was then removed under microscopic visualization and hemostasis was obtained with a bipolar.    The fascial layer was reapproximated with the use of a 0 Vicryl suture.  Subcutaneous tissue layer was reapproximated using 2-0 Vicryl suture.  3-0 monocryl was placed in subcuticular fashion. The skin was then cleansed and Dermabond was used to close the skin opening.  Patient was then rotated back to the preoperative bed awakened from anesthesia and taken to recovery all counts are correct in this case.  I performed the entire procedure.   Andalyn Heckstall K. Clois MD

## 2024-03-03 NOTE — Transfer of Care (Signed)
 Immediate Anesthesia Transfer of Care Note  Patient: Amber Walls  Procedure(s) Performed: FORAMINOTOMY 1 LEVEL (Right)  Patient Location: PACU  Anesthesia Type:General  Level of Consciousness: awake and alert   Airway & Oxygen Therapy: Patient Spontanous Breathing  Post-op Assessment: Report given to RN and Post -op Vital signs reviewed and stable  Post vital signs: Reviewed and stable  Last Vitals:  Vitals Value Taken Time  BP 138/49 03/03/24 18:08  Temp 36.2 C 03/03/24 18:08  Pulse 65 03/03/24 18:13  Resp 15 03/03/24 18:13  SpO2 97 % 03/03/24 18:13  Vitals shown include unfiled device data.  Last Pain:  Vitals:   03/03/24 1808  TempSrc:   PainSc: 0-No pain         Complications: No notable events documented.

## 2024-03-03 NOTE — Anesthesia Preprocedure Evaluation (Signed)
 Anesthesia Evaluation  Patient identified by MRN, date of birth, ID band Patient awake    Reviewed: Allergy & Precautions, H&P , NPO status , Patient's Chart, lab work & pertinent test results, reviewed documented beta blocker date and time   Airway Mallampati: II  TM Distance: >3 FB Neck ROM: full    Dental  (+) Teeth Intact   Pulmonary neg pulmonary ROS   Pulmonary exam normal        Cardiovascular Exercise Tolerance: Poor hypertension, On Medications negative cardio ROS Normal cardiovascular exam Rhythm:regular Rate:Normal     Neuro/Psych negative neurological ROS  negative psych ROS   GI/Hepatic Neg liver ROS,GERD  Medicated,,  Endo/Other  negative endocrine ROSdiabetes, Well Controlled    Renal/GU negative Renal ROS  negative genitourinary   Musculoskeletal   Abdominal   Peds  Hematology  (+) Blood dyscrasia, anemia   Anesthesia Other Findings Past Medical History: No date: Arthritis No date: Cancer (HCC) No date: Diabetes mellitus type II, controlled (HCC) No date: GERD (gastroesophageal reflux disease) No date: History of left breast cancer No date: HLD (hyperlipidemia) No date: Hypertension No date: Iron deficiency anemia No date: Pulmonary nodules No date: Vitamin B12 deficiency Past Surgical History: No date: HIP SURGERY; Left     Comment:  fracture femur No date: MASTECTOMY PARTIAL / LUMPECTOMY; Left BMI    Body Mass Index: 27.42 kg/m     Reproductive/Obstetrics negative OB ROS                              Anesthesia Physical Anesthesia Plan  ASA: 3  Anesthesia Plan: General ETT   Post-op Pain Management:    Induction:   PONV Risk Score and Plan: 4 or greater  Airway Management Planned:   Additional Equipment:   Intra-op Plan:   Post-operative Plan:   Informed Consent: I have reviewed the patients History and Physical, chart, labs and discussed  the procedure including the risks, benefits and alternatives for the proposed anesthesia with the patient or authorized representative who has indicated his/her understanding and acceptance.     Dental Advisory Given  Plan Discussed with: CRNA  Anesthesia Plan Comments:         Anesthesia Quick Evaluation

## 2024-03-03 NOTE — Interval H&P Note (Signed)
 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  The various methods of treatment have been discussed with the patient and family. After consideration of risks, benefits and other options for treatment, the patient has consented to  Procedure(s) with comments: FORAMINOTOMY 1 LEVEL (Right) - Right L4-5 Laminoforaminotomy, Minimally Invasive (MIS) as a surgical intervention.  The patient's history has been reviewed, patient examined, no change in status, stable for surgery.  I have reviewed the patient's chart and labs.  Questions were answered to the patient's satisfaction.    Heart sounds normal no MRG. Chest Clear to Auscultation Bilaterally.   Solangel Mcmanaway

## 2024-03-03 NOTE — Anesthesia Procedure Notes (Signed)
 Procedure Name: Intubation Date/Time: 03/03/2024 4:19 PM  Performed by: Brien Sotero PARAS, CRNAPre-anesthesia Checklist: Patient identified, Patient being monitored, Timeout performed, Emergency Drugs available and Suction available Patient Re-evaluated:Patient Re-evaluated prior to induction Oxygen Delivery Method: Circle system utilized Preoxygenation: Pre-oxygenation with 100% oxygen Induction Type: IV induction Ventilation: Mask ventilation without difficulty Laryngoscope Size: 3 and McGrath Grade View: Grade I Tube type: Oral Tube size: 7.0 mm Number of attempts: 1 Airway Equipment and Method: Stylet Placement Confirmation: ETT inserted through vocal cords under direct vision, positive ETCO2 and breath sounds checked- equal and bilateral Secured at: 21 cm Tube secured with: Tape Dental Injury: Teeth and Oropharynx as per pre-operative assessment

## 2024-03-04 NOTE — Anesthesia Postprocedure Evaluation (Signed)
 Anesthesia Post Note  Patient: Amber Walls  Procedure(s) Performed: FORAMINOTOMY 1 LEVEL (Right)  Patient location during evaluation: PACU Anesthesia Type: General Level of consciousness: awake and alert Pain management: pain level controlled Vital Signs Assessment: post-procedure vital signs reviewed and stable Respiratory status: spontaneous breathing, nonlabored ventilation and respiratory function stable Cardiovascular status: blood pressure returned to baseline and stable Postop Assessment: no apparent nausea or vomiting Anesthetic complications: no   No notable events documented.   Last Vitals:  Vitals:   03/03/24 1830 03/03/24 1843  BP: (!) 158/52 (!) 151/47  Pulse: (!) 59 65  Resp: 13 12  Temp: (!) 36.1 C (!) 36.2 C  SpO2: 96% 95%    Last Pain:  Vitals:   03/03/24 1843  TempSrc: Temporal  PainSc: 0-No pain                 Camellia Merilee Louder

## 2024-03-05 ENCOUNTER — Encounter: Payer: Self-pay | Admitting: Neurosurgery

## 2024-03-14 ENCOUNTER — Encounter: Payer: Self-pay | Admitting: Physician Assistant

## 2024-03-14 ENCOUNTER — Ambulatory Visit: Admitting: Physician Assistant

## 2024-03-14 VITALS — BP 134/80 | Temp 98.2°F | Ht 62.0 in | Wt 149.0 lb

## 2024-03-14 DIAGNOSIS — M5416 Radiculopathy, lumbar region: Secondary | ICD-10-CM

## 2024-03-14 NOTE — Progress Notes (Signed)
° °  REFERRING PHYSICIAN:  Pablo Perkins, Fnp [address] [place],  [place] [address]  DOS: 03/03/24, MIS R L4-5 laminoforaminotomy  HISTORY OF PRESENT ILLNESS: Amber Walls is approximately 2 weeks status post MIS right-sided L4-5 laminoforaminotomy. she is doing well.  She states her pain is gone.  She is not taking any pain medication.  She has no complaints at this time.   PHYSICAL EXAMINATION:  General: Patient is well developed, well nourished, calm, collected, and in no apparent distress.   NEUROLOGICAL:  General: In no acute distress.   Awake, alert, oriented to person, place, and time.  Pupils equal round and reactive to light.  Facial tone is symmetric.     Strength:            Side Iliopsoas Quads Hamstring PF DF EHL  R 4+ 5 5 5 4 4   L 4+ 5 5 5 5 5    Incision c/d/i   ROS (Neurologic):  Negative except as noted above  IMAGING: No interval imaging  ASSESSMENT/PLAN:  Amber Walls is doing very well approximately 2 weeks after MIS right-sided L4-5 laminoforaminotomy. she will follow up in approximately 1 month for 6-week postop visit.I have advised the patient to lift up to 10 pounds until 6 weeks after surgery, then increase up to 25 pounds until 12 weeks after surgery.  After 12 weeks post-op, the patient advised to increase activity as tolerated.  Advised to contact the office if any questions or concerns arise.  Lyle Decamp PA-C Department of neurosurgery

## 2024-04-13 ENCOUNTER — Encounter: Payer: Self-pay | Admitting: Neurosurgery

## 2024-04-13 ENCOUNTER — Ambulatory Visit: Admitting: Neurosurgery

## 2024-04-13 VITALS — BP 132/70 | Temp 97.7°F | Ht 62.0 in | Wt 149.0 lb

## 2024-04-13 DIAGNOSIS — M5416 Radiculopathy, lumbar region: Secondary | ICD-10-CM

## 2024-04-13 DIAGNOSIS — Z09 Encounter for follow-up examination after completed treatment for conditions other than malignant neoplasm: Secondary | ICD-10-CM

## 2024-04-13 NOTE — Progress Notes (Signed)
" ° °  REFERRING PHYSICIAN:  Pablo Perkins, Fnp [address] [place],  [place] [address]  DOS: 03/03/24, MIS R L4-5 laminoforaminotomy  HISTORY OF PRESENT ILLNESS: Amber Walls is status post MIS right-sided L4-5 laminoforaminotomy. she is doing well.    PHYSICAL EXAMINATION:  General: Patient is well developed, well nourished, calm, collected, and in no apparent distress.   NEUROLOGICAL:  General: In no acute distress.   Awake, alert, oriented to person, place, and time.  Pupils equal round and reactive to light.  Facial tone is symmetric.     Strength:            Side Iliopsoas Quads Hamstring PF DF EHL  R 5 5 5 5 5 5   L 5 5 5 5 5 5    Incision c/d/i   ROS (Neurologic):  Negative except as noted above  IMAGING: No interval imaging  ASSESSMENT/PLAN:  Amber Walls is doing very well after MIS right-sided L4-5 laminoforaminotomy.   She is due extremely well.  I reviewed her activity limitations.  Will see her back in 6 weeks  Reeves Daisy MD Department of neurosurgery    "

## 2024-05-17 ENCOUNTER — Encounter: Admitting: Physician Assistant
